# Patient Record
Sex: Female | Born: 1945 | Race: Black or African American | Hispanic: No | Marital: Single | Smoking: Never smoker
Health system: Southern US, Community
[De-identification: ages and names within clinical notes are randomized; demographics above are authoritative.]

## PROBLEM LIST (undated history)

## (undated) DIAGNOSIS — M1990 Unspecified osteoarthritis, unspecified site: Secondary | ICD-10-CM

## (undated) DIAGNOSIS — M545 Low back pain, unspecified: Secondary | ICD-10-CM

## (undated) DIAGNOSIS — K219 Gastro-esophageal reflux disease without esophagitis: Secondary | ICD-10-CM

## (undated) DIAGNOSIS — E039 Hypothyroidism, unspecified: Secondary | ICD-10-CM

## (undated) DIAGNOSIS — F32A Depression, unspecified: Secondary | ICD-10-CM

## (undated) DIAGNOSIS — Z9289 Personal history of other medical treatment: Secondary | ICD-10-CM

## (undated) DIAGNOSIS — G8929 Other chronic pain: Secondary | ICD-10-CM

## (undated) DIAGNOSIS — R0602 Shortness of breath: Secondary | ICD-10-CM

## (undated) DIAGNOSIS — I10 Essential (primary) hypertension: Secondary | ICD-10-CM

## (undated) DIAGNOSIS — F329 Major depressive disorder, single episode, unspecified: Secondary | ICD-10-CM

## (undated) DIAGNOSIS — F419 Anxiety disorder, unspecified: Secondary | ICD-10-CM

## (undated) HISTORY — PX: TOTAL KNEE ARTHROPLASTY: SHX125

## (undated) HISTORY — PX: BACK SURGERY: SHX140

## (undated) HISTORY — PX: EXCISIONAL HEMORRHOIDECTOMY: SHX1541

## (undated) HISTORY — PX: ABDOMINAL HYSTERECTOMY: SHX81

## (undated) HISTORY — PX: CATARACT EXTRACTION W/ INTRAOCULAR LENS  IMPLANT, BILATERAL: SHX1307

## (undated) HISTORY — PX: TUBAL LIGATION: SHX77

## (undated) HISTORY — PX: REDUCTION MAMMAPLASTY: SUR839

## (undated) HISTORY — PX: JOINT REPLACEMENT: SHX530

## (undated) HISTORY — PX: CARPAL TUNNEL RELEASE: SHX101

---

## 1999-06-30 ENCOUNTER — Other Ambulatory Visit: Admission: RE | Admit: 1999-06-30 | Discharge: 1999-06-30 | Payer: Self-pay | Admitting: *Deleted

## 1999-09-04 ENCOUNTER — Encounter: Payer: Self-pay | Admitting: Orthopedic Surgery

## 1999-09-08 ENCOUNTER — Ambulatory Visit (HOSPITAL_COMMUNITY): Admission: RE | Admit: 1999-09-08 | Discharge: 1999-09-08 | Payer: Self-pay | Admitting: Orthopedic Surgery

## 1999-10-05 ENCOUNTER — Encounter: Admission: RE | Admit: 1999-10-05 | Discharge: 1999-10-23 | Payer: Self-pay | Admitting: Orthopedic Surgery

## 2002-03-22 DIAGNOSIS — Z9289 Personal history of other medical treatment: Secondary | ICD-10-CM

## 2002-03-22 HISTORY — DX: Personal history of other medical treatment: Z92.89

## 2002-03-22 HISTORY — PX: POSTERIOR LUMBAR FUSION: SHX6036

## 2004-12-30 ENCOUNTER — Inpatient Hospital Stay (HOSPITAL_COMMUNITY): Admission: RE | Admit: 2004-12-30 | Discharge: 2005-01-04 | Payer: Self-pay | Admitting: Orthopedic Surgery

## 2004-12-30 ENCOUNTER — Ambulatory Visit: Payer: Self-pay | Admitting: Physical Medicine & Rehabilitation

## 2005-01-04 ENCOUNTER — Inpatient Hospital Stay
Admission: RE | Admit: 2005-01-04 | Discharge: 2005-01-08 | Payer: Self-pay | Admitting: Physical Medicine & Rehabilitation

## 2009-06-30 ENCOUNTER — Encounter: Admission: RE | Admit: 2009-06-30 | Discharge: 2009-06-30 | Payer: Self-pay | Admitting: Family Medicine

## 2010-08-07 NOTE — Op Note (Signed)
[place]  Patient:    [name], [name]                      MRN: [id number] Proc. Date: 09/08/99 Adm. Date:  [id number] Attending:  Wende Mott                           Operative Report  PREOPERATIVE DIAGNOSIS:  Internal derangement of right knee.  POSTOPERATIVE DIAGNOSIS: 1. 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 2. Chondral defect of mediofemoral condyle. 3. Loose bodies. 4. Medial meniscal tear. 5. Partial anterior cruciate ligament tear. 6. Chondral defect of the patella inferior pole.  OPERATION: 1. Arthroscopic complete synovectomy. 2. Chondroplasty medial femoral condyle condyle and inferior pole of the    patella. 3. Removal of loose bodies. 4. Medial meniscectomy.  SURGEON:  Kennieth Rad, M.D.  ANESTHESIA:  General  DESCRIPTION OF PROCEDURE:  The patient was taken to the operating room after having been given adequate prior medication.  The patient was given general anesthesia and was intubated.  The right knee was prepped with DuraPrep and draped in a sterile manner.  A tourniquet was used for hemostasis.  A 1/2 inch puncture wound was made along the anteromedial and lateral joint line.  Inflow was through the medial suprapatellar pouch area.  Inspection of the joint revealed chondral defects following the mediofemoral condyle.  Chronic tear of the medial meniscus, chronic ACL tear with remnant stump, fibrotic thickened plica with synovitis medial and lateral compartments as well as suprapatellar pouch area.  There was a chondromalacic defect involving the inferior pole of the patella.  The lateral femoral condyle was well preserved.  With the synovial shaver, complete synovectomy was done at both medial and lateral compartments as well as suprapatellar pouch area.  With basket forceps, a complete meniscectomy of medial meniscus was done from the posterior all the way to the  anterior aspect.  Chondral defect was found in the mediofemoral condyle as well as the anterior pole of the patella and was shaved with meniscus shaver and then smoothed with the arthroscopic rasp. Copious and abundant irrigation was done.  The remnant stump of the ACL was completely resected along with the thickened fibrotic plica.  Further copious and abundant irrigation was done.  Wound closure was then done with 3-0 nylon, and 10 cc of 0.5% Marcaine was injected into the joint.   Compressive dressing was applied, knee immobilizer applied.  The patient tolerated the procedure quite well and went to the recovery room in stable and satisfactory condition.  The patient is being discharged home, nonweightbearing with the use of crutches on the right side, Percocet 1 p.o. p.r.n. for pain, and to continue her previous medications at home.  The patient is to return to our office in one week.  Continue ice packs and elevation at home.  The patient is being discharged in stable and satisfactory condition. DD:  09/08/99 TD:  09/08/99 Job: [id number]

## 2010-08-07 NOTE — H&P (Signed)
[place]. [facility]  Patient:    [name], [name]                      MRN: [id number] Adm. Date:  [id number] Attending:  Wende Mott                         History and Physical  CHIEF COMPLAINT:  Painful swollen right knee.  HISTORY OF PRESENT ILLNESS:  This is a 65 year old female whom I have been following in the office for recurrent pain, swelling, and genu varum of the right knee.  The patient has been treated with anti-inflammatories and exercise with progressive worsening over the past several weeks.  The patient had been using warm compresses to the knee as well as running shoes.  PAST MEDICAL HISTORY:  Bilateral breast biopsy in 1998, tonsillectomy and adenoidectomy, carpal tunnel release bilaterally, cyst removed from breast x 2, arthroscopy of both knees.  High blood pressure.  MEDICATIONS: Premarin, Limbiax, Synthroid, Ultram, Celebrex, Capozide.  ALLERGIES:  None known.  HABITS:  None.  REVIEW OF SYSTEMS:  Basically that of History of Present Illness. No chronic respiratory, no urinary or bowel symptoms.  PHYSICAL EXAMINATION:  VITAL SIGNS: Temperature 98.2, pulse 78, respirations 16, blood pressure 120/70, height 5 feet 4 inches, weight 200 pounds.  HEENT:  Normocephalic.  Eyes: Conjunctivae and sclerae clear.  NECK: Supple.  CHEST:  Clear.  CARDIAC:  S1, S2 regular.  EXTREMITIES:  Right knee genu varum with the lack of full extension by 12 degrees, flexion 100 degrees, tender.  Crepitus with McMurrays test over the medial compartment, anterior, and anterolateral compartments.  Tenderness to palpation, +2 effusion.  Quadriceps muscle tone fair.  IMPRESSION:  Internal derangement of the right knee. DD:  09/08/99 TD:  09/08/99 Job: [id number]

## 2010-08-07 NOTE — H&P (Signed)
[name], BERENGUER NO.:  [id number]   MEDICAL RECORD NO.:  [id number]            PATIENT TYPE:   LOCATION:                                 FACILITY:   PHYSICIAN:  Dyke Brackett, M.D.    DATE OF BIRTH:  06/15/1945   DATE OF ADMISSION:  12/30/2004  DATE OF DISCHARGE:                                HISTORY & PHYSICAL   CHIEF COMPLAINT:  End-stage osteoarthritis, left knee.   HISTORY OF PRESENT ILLNESS:  The patient is a pleasant 65 year old African-  American female with bilateral leg pain, currently left greater than right.  The patient has history of bilateral knee scopes.  The patient denies any  injury to the left knee.  Left knee pain increasingly worse over the past 6  to 7 months.  The patient describes her left knee pain as an intermittent  pain, worse with ambulation.  The pain is sharp in nature.  She does note  some radiation of the left knee pain down the left leg.  Left knee positive  for giving way and catching.  She does have waking pain.  The pain is better  with rest, and Celebrex helps with the inflammation of the left knee.   ALLERGIES:  No known drug allergies.   MEDICATIONS:  1.  Tramadol 50 mg 1 to 2 tablets daily p.r.n. pain.  2.  Benicar stopped 1 month ago.  3.  Toprol 50 mg 1/2 tablet q.a.m. and 1/2 tablet q.p.m.  4.  Celebrex 200 mg 1 tablet daily.  5.  Synthroid 0.075 mg 1 tablet daily.  6.  Librax 5/2.5 mg 1 p.o. q.a.m.  7.  Hydrochlorothiazide 25 mg 1 p.o. daily.  8.  Prevacid 30 mg SolTabs p.r.n.   PAST MEDICAL HISTORY:  1.  Essential hypertension.  2.  Dyslipidemia.  3.  Morbid obesity.  4.  Hypothyroidism.  5.  GERD.  6.  Borderline diabetes.   PAST SURGICAL HISTORY:  1.  Partial hysterectomy in 1976.  2.  Knee arthroscopy in 1992.  3.  Knee arthroscopy in 1998.  4.  Breast reduction in 1998.  5.  Back and hip surgery in 2004.  6.  Carpal tunnel surgery, both hands, 1986 and 1988.  7.  Questionable lipoma removal  underneath right arm in 1963.   The patient denies any complications with the above procedures.  She did  have 2 units of blood after back surgery in 2004.   SOCIAL HISTORY:  The patient denies any tobacco or alcohol use.  She is  divorced.  She has 4 adult children.  She lives in a [place] home with  four steps to the usual entrance.  She is currently disabled.  Primary care  physician is Dr. Maris Berger, [facility], [place].  Phone number is  [phone].   FAMILY HISTORY:  The patient's mother deceased at age 74 due to MVA.  She  did have a history of anemia.  Father deceased age 82, unsure of cause of  death.  She has one living brother who is age 65 and has  heart disease.   REVIEW OF SYSTEMS:  The patient denies any recent cold, coughing, or flu-  like symptoms.  She denies any chest pain, shortness of breath, PND,  orthopnea.  She does note occasional reflux.  She wears dentures on the top  and glasses at all times.  She has hearing difficulty in the right ear.  The  patient does note increased dry mouth over the past couple of weeks.  She is  drinking more liquids.  She relates this to being on hydrochlorothiazide.  She also notes increase in nocturia, going 4 to 5 times a night, especially  over the past 6 months.  Otherwise, her Review of Systems is negative or  noncontributory.   The patient does not have a living will.  She does have a power of attorney,  Haynes Kerns, who is her daughter.   PHYSICAL EXAMINATION:  GENERAL:  Well-developed, well-nourished, overweight  female who walks with the use of a cane.  She does have an antalgic gait and  a limp most notably on the left.  The patient has bilateral valgus  deformities.  The patient's mood and affect are appropriate.  She talks  easily with examiner.  VITAL SIGNS:  The patient's present weight is 208 pounds.  Temperature 97.5  degrees F, pulse 64, respiratory rate 16, blood pressure 132/76.  CARDIAC:   Regular rate and rhythm.  No murmurs, rubs, or gallops noted.  RESPIRATORY: Lungs are clear to auscultation bilaterally.  No wheezing,  rhonchi, or rales noted.  ABDOMEN:  Obese, nontender, positive bowel sounds throughout.  HEENT:  Head is normocephalic and atraumatic without frontal or maxillary  sinus tenderness to palpation.  PERRLA.  EOMs intact.  Conjunctivae pink.  Sclerae anicteric.  No visible external ear deformities noted.  TMs pearly  gray bilaterally.  Nose and nasal septum midline.  Nasal mucosa pink and  moist without polyps.  Buccal mucosa pink and moist.  Pharynx without  erythema or exudate.  The patient has dentures on top, noted not to have any  dentures on the bottom.  Tongue and uvula midline.  NECK:  The patient has full range of motion of the cervical spine without  pain.  Carotids are 2+ without bruits.  No lymphadenopathy.  No tenderness  with palpation over the cervical spine.  Trachea is midline.  BACK: The patient has no tenderness with palpation over the thoracic spine.  She has mild to moderate pain with palpation in the mid lumbar region.  BREASTS/GENITALIA/RECTAL: Exams all deferred at this time.  NEUROLOGIC: The patient is alert and oriented x3.  Cranial nerves II-XII  grossly intact.  Lower extremity strength testing reveals 5/5 strength  throughout, slightly weaker in the left compared to the right, but again  overall 5/5.  Deep tendon reflexes: Knees are 1+ bilaterally, and ankle  jerks are 1+ bilaterally.  MUSCULOSKELETAL:  Upper extremities are equal and symmetric in size and  shape.  The patient has full range of motion of the upper extremities  throughout.  Radial pulses are 2+ bilaterally.  LOWER EXTREMITIES:  In the lower extremities, the patient has full range of  motion of both hips without pain.  Left knee is 0 to 105 degrees flexion. No tenderness to palpation along the joint line.  No effusion and no edema  noted.  She does have  approximately 5 degree varus deformity of the left  knee.  Anterior drawer is negative.  Valgus varus stressing is negative.  Right  knee is 0 to 90 degrees flexion.  She has tenderness to palpation  along the medial joint line.  No effusion, no edema noted.  She has 5  degrees of varus deformity.  Valgus varus stressing is negative.  Anterior  drawer is negative.  The lower extremities are not edematous bilaterally.  Dorsal pedal pulses are 2+ bilaterally.  Otherwise, she has good sensation  to light touch throughout the toes bilaterally.   X-rays bilateral knees shows end-stage osteoarthritis of both knees.   IMPRESSION:  1.  End-stage osteoarthritis bilateral knee, currently left more symptomatic      than right.  2.  Essential hypertension.  3.  Dyslipidemia.  4.  Morbid obesity.  5.  Hypothyroidism.  6.  Gastroesophageal reflux disease.  7.  Questionable diabetes mellitus (borderline on low-modified-carbohydrate      diet).   PLAN:  The patient is to be admitted to [facility] on December 30, 2004, to undergo a left total knee arthroplasty by Dr. Madelon Lips.  The patient  is to undergo all preoperative labs and testing prior to the surgery.  The  patient did receive medical clearance from her primary care physician and  also from her cardiologist, Terald Sleeper, M.D.  The patient did undergo a  stress test which did not reveal any evidence of ischemia.      Richardean Canal, Arnetha Courser, M.D.  Electronically Signed    GC/MEDQ  D:  12/23/2004  T:  12/23/2004  Job:  [id number]   cc:   Maris Berger, M.D.  [place], [place]

## 2010-08-07 NOTE — Discharge Summary (Signed)
[name], [name]               ACCOUNT NO.:  [id number]   MEDICAL RECORD NO.:  [id number]          PATIENT TYPE:  ORB   LOCATION:  4527                         FACILITY:  [facility]   PHYSICIAN:  [name], M.D.DATE OF BIRTH:  [birth year]   DATE OF ADMISSION:  01/04/2005  DATE OF DISCHARGE:  01/07/2005                                 DISCHARGE SUMMARY   DISCHARGE DIAGNOSES:  1.  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  2.  Pain management.  3.  Subcutaneous Lovenox for deep venous thrombosis prophylaxis.  4.  Postoperative anemia.  5.  Hypertension.  6.  Hypokalemia resolved.  7.  Hypothyroidism.  8.  Gastroesophageal reflux disease.   HISTORY OF PRESENT ILLNESS:  This is a 65 year old female admitted December 30, 2004 with end-stage changes of the left knee and no relief with  conservative care.  Underwent a left total knee arthroplasty December 30, 2004 per Dr. Madelon [name].  Placed on subcutaneous Lovenox for deep venous  thrombosis prophylaxis.  PCA morphine discontinued January 02, 2005.  Advised weight-bearing as tolerated left lower extremity.  Postoperative  anemia 9.9, transfused one unit packed red blood cells January 01, 2005 with  followup hemoglobin 10.4.  Hypokalemia 3.1 with supplement added, the  patient was admitted to subacute care services.   PAST MEDICAL HISTORY:  See discharge diagnoses.  No alcohol or tobacco.   ALLERGIES:  NONE.   SOCIAL HISTORY:  Lives alone on disability, one level home, four steps to  entry, family works day shift.   MEDICATIONS PRIOR TO ADMISSION:  1.  Tramadol daily.  2.  Benicar 40-25 daily.  3.  Lopressor 25 mg twice daily.  4.  Celebrex 200 mg daily.  5.  Synthroid 0.75 mg daily.  6.  Librax 5-2.5 daily.  7.  Hydrochlorothiazide 25 mg daily.  8.  Prevacid 30 mg daily.   HOSPITAL COURSE:  The patient with progressive gains while in rehab services  with therapies  initiated daily, the following issues are followed during the  patient's rehab course.  Pertaining to [name]'s left total knee  arthroplasty, surgical site healing nicely, no signs of infection, weight-  bearing as tolerated, ambulating extended household distances with a walker.  Pain management ongoing with the use of oxycodone, Ultram, and Robaxin with  good results.  She was on subcutaneous Lovenox for deep venous thrombosis  prophylaxis, her calves remain cool without any swelling, erythema and  nontender.  Postoperative anemia stable on iron supplement with latest  hemoglobin of 10.  Blood pressures controlled with hydrochlorothiazide and  Lopressor.  During her subacute course with hypokalemia at 3.1, supplement  added, latest potassium of 3.7.  She remained on her hormone supplement for  hypothyroidism.  Prevacid for gastroesophageal reflux disease.   Overall for her functional status she was ambulating household distances  with a walker, needing minimal assist for lower body dressing, she was  independent upper body dressing, home health therapies would be arranged.   Latest labs showed a sodium 139, potassium  3.7, BUN 10, creatinine 0.8,  hemoglobin 10, hematocrit 29.5.   DISCHARGE MEDICATIONS AT TIME OF DICTATION:  1.  Celebrex 200 mg daily.  2.  Trinsicon one capsule twice daily.  3.  Prevacid 30 mg daily.  4.  Hydrochlorothiazide 25 mg daily.  5.  Librax 1 capsule daily.  6.  Synthroid 75 mcg daily.  7.  Lopressor 25 mg twice daily.  8.  Potassium chloride 20 mEq daily.  9.  Robaxin 500 mg every six hours as needed spasms.  10. Ultram 50 mg every six hours as needed pain.   She would followup with Dr. [facility]elon [name] as advised.  Medical  management with her primary care [name] Dr. Sofie [name] of [facility].  Home  health therapies had been arranged.      [name], P.A.      [name], M.D.  Electronically Signed    DA/MEDQ  D:   01/07/2005  T:  01/07/2005  Job:  [id number]   cc:   [name], M.D.  Fax: [phone]

## 2010-08-07 NOTE — Op Note (Signed)
[name], [name]               ACCOUNT NO.:  [id number]   MEDICAL RECORD NO.:  [id number]          PATIENT TYPE:  INP   LOCATION:  5030                         FACILITY:  [facility]   PHYSICIAN:  Dyke Brackett, M.D.    DATE OF BIRTH:  1945-09-30   DATE OF PROCEDURE:  12/30/2004  DATE OF DISCHARGE:                                 OPERATIVE REPORT   PREOPERATIVE DIAGNOSIS:  Osteoarthritis, left knee.   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   OPERATION:  1.  Left total knee replacement (cemented LCS standard femur, standard      patella, 2.5 mm tibia with 15 mm bearing).  2.  Repair infrapatellar tendon.   SURGEON:  Dyke Brackett, M.D.   ASSISTANT:  Richardean Canal, P.A.-C.   TOURNIQUET TIME:  1 hour 7 minutes.   DESCRIPTION OF PROCEDURE:  Sterile prep and drape, exsanguination of the  leg, inflation of the tourniquet to 350.  A straight skin incision, medial  parapatellar approach to the knee was made.  The tibial tubercle showed  significant calcification, possible old Osgood-Schlatter, despite careful  protection, part of the patellar tendon was not intact and was partially  avulsed with repair with Mitek anchors at the conclusion of the case with a  good, stable repair.  It is not a complete tendon issue but was repaired as  part of the procedure.  After the completion of the procedure, the tibial  alignment guide was used cutting about 2-3 mm below the most diseased medial  compartment correcting the varus deformity followed by the anterior  posterior femoral cut, 4 degree valgus cut, balance in the flexion extension  gap at about 15 mm, ACL, PCL were released, small remnants of the menisci  were removed.  Chamfer cuts were placed on the femoral side followed by the  peg holes for the prosthesis, the patella was cut leaving about 13-14 mm of  native patella.  Prior to this, the tibia was cut with  a keel hole for the  prosthesis with a size 2.5 tibia.  Trial reduction was carried out with full  extension, absolutely no tendency to bury and spin out, good stability, and  equal balance in the ligaments.  Anterior and posterior drawer was  negligible.  The trial components were removed, final components were  inserted with the cement in the doughy state.  Antibiotic impregnated cement  was used.  At the conclusion of the case, the cement was allowed to harden,  excess cement was removed, the tourniquet was released, small bleeders were  coagulated.  A Hemovac drain was placed exiting superolaterally and a  Hemovac placed in the lateral gutter.  The patellar tendon was repaired with  Mitek anchors as mentioned.  The capsule was meticulously repaired with #1  Ethibond, subcutaneous tissues with 2-0 Vicryl, and the skin with a stapling  device.  The patient had had a preoperative femoral nerve block.      Dyke Brackett, M.D.  Electronically Signed     WDC/MEDQ  D:  12/30/2004  T:  12/30/2004  Job:  [id number]

## 2010-08-07 NOTE — Discharge Summary (Signed)
[name], [name]               ACCOUNT NO.:  [id number]   MEDICAL RECORD NO.:  [id number]          PATIENT TYPE:  INP   LOCATION:  5030                         FACILITY:  [facility]   PHYSICIAN:  Dyke Brackett, M.D.    DATE OF BIRTH:  1945/09/22   DATE OF ADMISSION:  12/30/2004  DATE OF DISCHARGE:  01/04/2005                                 DISCHARGE SUMMARY   ADMISSION DIAGNOSES:  1.  End-stage osteoarthritis bilateral knees currently left knee worsened      than the right.  2.  Essential hypertension.  3.  Dyslipidemia.  4.  Morbid obesity.  5.  Hypothyroidism.  6.  Gastroesophageal reflux disease.  7.  Questionable diabetes mellitus, borderline on low modified carbohydrate      diet.   DISCHARGE DIAGNOSES:  1.  Status post left total knee arthroplasty.  2.  Acute blood loss anemia secondary to surgery requiring blood      transfusion.  3.  Hypokalemia, treated.  4.  Essential hypertension.  5.  Dyslipidemia.  6.  Morbid obesity.  7.  Hypothyroidism.  8.  Gastroesophageal reflux disease.  9.  Questionable diabetes on modified carbohydrate diet.   HISTORY OF PRESENT ILLNESS:  Mrs. Cozzens is a pleasant 65 year old African  American female with bilateral leg pain, currently left knee being greater  than right.  Patient has history of bilateral knee arthroscopies.  Patient  denies any injury to left knee.  Left knee pain increasingly worse over the  past six to seven months.  Pain is described as intermittent pain worse with  ambulation that is sharp in nature.  Patient does note some radiation of the  left knee pain down the left leg.  Mechanical symptoms positive for giving  way and catching.  Positive waking pain.   ALLERGIES:  NO KNOWN DRUG ALLERGIES.   MEDICATIONS:  1.  Tramadol 50 mg one to two tablets daily p.r.n. pain.  2.  Benicar stopped one month ago.  3.  Toprol 50 mg half tablet q.a.m. and half tablet q.p.m.  4.  Celebrex 200 mg one tablet daily.  5.   Synthroid 0.075 mg daily.  6.  Librax 5/2.5 mg one p.o. q.a.m.  7.  Hydrochlorothiazide 25 mg one p.o. daily.  8.  Prevacid 30 mg Solu-Tab p.r.n.   PROCEDURE:  Patient was taken to the operating room on December 30, 2004, by  Dr. Frederico Hamman, assisted by Richardean Canal, P.A.  Patient was placed  under general anesthesia and received a supplemental femoral nerve block.  Patient tolerated the procedure well and returned to recovery in good stable  condition.  The following components were used for left knee replacement.  A  standard left femoral component, a size 2.5 tibial tray, a metal back  patella standard and the 15 mm poly insert.   CONSULTATIONS:  The following consults were obtained:  1.  Case management.  2.  PT.  3.  OT.  4.  Rehab.   HOSPITAL COURSE:  Postoperative day #1, patient afebrile, vital signs  stable. H&H is 10.1 and 29.4.  Patient  asymptomatic.  Patient was  hypokalemic, 2.8, potassium was replaced.   Next, postoperative #2, patient feeling dizzy and weak, no chest pain, no  shortness of breath, or nausea or vomiting.  Patient's H&H is 9.9 and 28.5.  Heart rate was 103.  Pressure was 154/88.  Patient was transfused with 1  unit packed red blood cells.  Potassium remained low at 2.9 and was  continued to be replaced.  Rehab consult was obtained due to the fact  patient lives alone.   On postoperative day #3, patient with T-max of 101.3, otherwise vital signs  stable.  H&H was 10.4 and 30.5. Patient was asymptomatic in regard to the  anemia.  Potassium was at 3.1.   On postoperative day #4, patient afebrile, vital signs stable.  Patient  without complaints.  Patient was evaluated for rehab bed and once bed became  available that day, she was transferred to rehab in good stable condition.   LABORATORY DATA:  Routine labs on admission, CBC, all values within normal  limits.  Coags on admission, all values within normal limits.  Routine  chemistries on  admission, glucose was slightly elevated at 104, otherwise  all values within normal limits.  Hepatic enzymes on admission, all values  within normal limits.  Urinalysis was negative.  Urine culture was negative  growth.   Two views left knee done December 30, 2004, showed total knee replacement in  satisfactory position.  Soft tissue drain and staples are in place.   MEDICATIONS ON THE FLOOR:  1.  Reglan 10 mg IV q.6h. p.r.n.  2.  Zofran 1 mg IV q.6h. p.r.n.  3.  Benadryl 25 mg IV q.6h. p.r.n.  4.  Colace 100 mg p.o. b.i.d.  5.  Senokot one tablet p.o. p.r.n.  6.  Enema of choice p.r. p.r.n.  7.  Lovenox 30 mg subcu q.12h. total of 14 days, first dose was December 31, 2004, at 7:31 a.m.  8.  Tylenol 325 to 650 mg one p.o. q.4h. p.r.n.  9.  Robaxin 500 mg p.o. q.6h. p.r.n.  10. Restoril 15 mg p.o. nightly p.r.n.  11. Reglan 10 mg p.o. q.8h. p.r.n.  12. Hydrochlorothiazide 25 mg p.o. daily.  13. Protonix 40 mg p.o. daily.  14. Librax one capsule daily.  15. Levothyroxine 75 mg p.o. daily.  16. Ultram 50 to 100 mg p.o. daily.  17. Lopressor 25 mg p.o. b.i.d.  18. Potassium chloride 40 mg p.o. daily.  19. Celebrex 200 mg p.o. daily.   DISCHARGE INSTRUCTIONS:  1.  Medications:  Four medications are to be adjusted by rehab physician.  2.  Diet:  Medication modified carb diet.  3.  Weightbearing:  Patient is weightbearing as tolerated with walker on      left leg.  4.  Wound care:  Wound is to be kept clean, dressing changes daily.  Dr.      Madelon Lips is to be called if patient has temperature greater than 101.5,      any foul smelling drainage or pain not adequately controlled.  Next,      staples will need to be removed 14 days postoperatively.   SPECIAL INSTRUCTIONS:  CPM 0 to 90 degrees six to eight hours a day.  Increase 10 degrees daily.  Patient needs knee immobilizer on when walking  only.  FOLLOW UP:  Patient needs follow-up with Dr. Madelon Lips in the office on   postoperative day #14. Patient is to call the  office at [phone] for an  appointment.   CONDITION ON DISCHARGE:  Patient was discharged postoperative day #4 to  rehab in good stable condition.      Richardean Canal, Arnetha Courser, M.D.  Electronically Signed    GC/MEDQ  D:  02/19/2005  T:  02/19/2005  Job:  [id number]

## 2012-05-02 ENCOUNTER — Other Ambulatory Visit: Payer: Self-pay | Admitting: Internal Medicine

## 2012-05-02 DIAGNOSIS — R51 Headache: Secondary | ICD-10-CM

## 2012-05-09 ENCOUNTER — Ambulatory Visit
Admission: RE | Admit: 2012-05-09 | Discharge: 2012-05-09 | Disposition: A | Discharge status: Home / Self Care | Payer: Medicare Other | Source: Ambulatory Visit | Attending: Internal Medicine | Admitting: Internal Medicine

## 2012-05-09 DIAGNOSIS — R51 Headache: Secondary | ICD-10-CM

## 2012-08-30 ENCOUNTER — Other Ambulatory Visit: Payer: Self-pay | Admitting: Orthopedic Surgery

## 2012-08-30 DIAGNOSIS — M5136 Other intervertebral disc degeneration, lumbar region: Secondary | ICD-10-CM

## 2012-09-06 ENCOUNTER — Other Ambulatory Visit: Payer: Medicare Other

## 2012-09-06 ENCOUNTER — Inpatient Hospital Stay
Admission: RE | Admit: 2012-09-06 | Discharge: 2012-09-06 | Disposition: A | Discharge status: Home / Self Care | Payer: Medicare Other | Source: Ambulatory Visit | Attending: Orthopedic Surgery | Admitting: Orthopedic Surgery

## 2012-09-12 ENCOUNTER — Ambulatory Visit
Admission: RE | Admit: 2012-09-12 | Discharge: 2012-09-12 | Disposition: A | Discharge status: Home / Self Care | Payer: Medicare Other | Source: Ambulatory Visit | Attending: Orthopedic Surgery | Admitting: Orthopedic Surgery

## 2012-09-12 VITALS — BP 120/64 | HR 68 | Ht 62.0 in | Wt 227.0 lb

## 2012-09-12 DIAGNOSIS — M5136 Other intervertebral disc degeneration, lumbar region: Secondary | ICD-10-CM

## 2012-09-12 MED ORDER — ONDANSETRON HCL 4 MG/2ML IJ SOLN
4.0000 mg | Freq: Once | INTRAMUSCULAR | Status: AC
  Administered 2012-09-12: 4 mg via INTRAMUSCULAR

## 2012-09-12 MED ORDER — DIAZEPAM 5 MG PO TABS
5.0000 mg | ORAL_TABLET | Freq: Once | ORAL | Status: AC
  Administered 2012-09-12: 5 mg via ORAL

## 2012-09-12 MED ORDER — MEPERIDINE HCL 100 MG/ML IJ SOLN
75.0000 mg | Freq: Once | INTRAMUSCULAR | Status: AC
  Administered 2012-09-12: 75 mg via INTRAMUSCULAR

## 2012-09-12 MED ORDER — IOHEXOL 180 MG/ML  SOLN
15.0000 mL | Freq: Once | INTRAMUSCULAR | Status: AC | PRN
  Administered 2012-09-12: 15 mL via INTRATHECAL

## 2012-09-12 NOTE — Progress Notes (Signed)
Pt states she has been off tramadol since Saturday.  Explained discharge instructions to pt.JKL RN

## 2013-09-11 ENCOUNTER — Other Ambulatory Visit: Payer: Self-pay | Admitting: Orthopedic Surgery

## 2013-09-11 DIAGNOSIS — M5136 Other intervertebral disc degeneration, lumbar region: Secondary | ICD-10-CM

## 2013-10-05 ENCOUNTER — Ambulatory Visit
Admission: RE | Admit: 2013-10-05 | Discharge: 2013-10-05 | Disposition: A | Discharge status: Home / Self Care | Payer: Medicare HMO | Source: Ambulatory Visit | Attending: Orthopedic Surgery | Admitting: Orthopedic Surgery

## 2013-10-05 VITALS — BP 139/68 | HR 71

## 2013-10-05 DIAGNOSIS — M5136 Other intervertebral disc degeneration, lumbar region: Secondary | ICD-10-CM

## 2013-10-05 MED ORDER — MORPHINE SULFATE 4 MG/ML IJ SOLN
6.0000 mg | Freq: Once | INTRAMUSCULAR | Status: AC
  Administered 2013-10-05: 6 mg via INTRAMUSCULAR

## 2013-10-05 MED ORDER — ONDANSETRON HCL 4 MG/2ML IJ SOLN
4.0000 mg | Freq: Once | INTRAMUSCULAR | Status: AC
  Administered 2013-10-05: 4 mg via INTRAMUSCULAR

## 2013-10-05 MED ORDER — DIAZEPAM 5 MG PO TABS
5.0000 mg | ORAL_TABLET | Freq: Once | ORAL | Status: AC
  Administered 2013-10-05: 5 mg via ORAL

## 2013-10-05 MED ORDER — IOHEXOL 180 MG/ML  SOLN
15.0000 mL | Freq: Once | INTRAMUSCULAR | Status: AC | PRN
  Administered 2013-10-05: 15 mL via INTRA_ARTICULAR

## 2013-10-05 NOTE — Progress Notes (Signed)
Patient states she has been off Tramadol for the past two days.  jkl 

## 2013-10-05 NOTE — Discharge Instructions (Signed)
Myelogram Discharge Instructions  1. Go home and rest quietly for the next 24 hours.  It is important to lie flat for the next 24 hours.  Get up only to go to the restroom.  You may lie in the bed or on a couch on your back, your stomach, your left side or your right side.  You may have one pillow under your head.  You may have pillows between your knees while you are on your side or under your knees while you are on your back.  2. DO NOT drive today.  Recline the seat as far back as it will go, while still wearing your seat belt, on the way home.  3. You may get up to go to the bathroom as needed.  You may sit up for 10 minutes to eat.  You may resume your normal diet and medications unless otherwise indicated.  Drink plenty of extra fluids today and tomorrow.  4. The incidence of a spinal headache with nausea and/or vomiting is about 5% (one in 20 patients).  If you develop a headache, lie flat and drink plenty of fluids until the headache goes away.  Caffeinated beverages may be helpful.  If you develop severe nausea and vomiting or a headache that does not go away with flat bed rest, call [phone].  5. You may resume normal activities after your 24 hours of bed rest is over; however, do not exert yourself strongly or do any heavy lifting tomorrow.  6. Call your physician for a follow-up appointment.   You may resume Tramadol on Saturday, October 06, 2013 after 9:30a.m.

## 2013-11-19 NOTE — H&P (Signed)
[name] is an 68 y.o. female.   History of Present Illness  The patient is a 68 year old female who comes in today for a preoperative history and physical. The patient is scheduled for a XLIF L2-4 W/Removal of hardware and revision to be performed by Dr. Debria Garret D. Shon Baton, MD at [facility] on 09.09.2015 . Symptoms unchanged from previous visit.   Additional reasons for visit:  Follow-up back is described as the following: The patient is being followed for their central back pain. Symptoms reported today include: pain (lower lumbar ), pain at night, weakness (left lower ext. at times ) and numbness (at times about the left lower ext. to the foot level ), while the patient does not report symptoms of: urinary incontinence. The patient states that they are doing poorly. Current treatment includes: activity modification and pain medications. The following medication has been used for pain control: Ultram. The patient reports their current pain level to be 10 / 10 (can increase at times). The patient presents today following CT scan (10/05/13), myelogram (with CT performed at GI ) and physical therapy (patient stopped PT and joined the [facility] and did aquatic therapy ). The patient indicates that they have questions or concerns today regarding pain and their progress at this point. Note for "Follow-up back": Had injection with Dr. Ethelene Hal in November of last year, which helped with her symptoms until around march of this year.  Allergies  No Known Drug Allergies06/05/2012  Family History  Osteoarthritis mother, grandmother mothers side and grandmother fathers side First Degree Relatives reported Cancer First Degree Relatives. child Hypertension brother and child brother Heart Disease brother  Social History Alcohol use never consumed alcohol Children 3 5 or more Drug/Alcohol Rehab (Currently) no Current work status disabled Tobacco use Never smoker. never smoker Pain Contract  no Exercise Exercises weekly; does other Exercises rarely Drug/Alcohol Rehab (Previously) no Illicit drug use no Number of flights of stairs before winded 2-3 less than 1 Marital status divorced Never smoker Living situation live alone Tobacco / smoke exposure no  Medication History  CeleBREX (  Capsule, Oral) Active. Benicar HCT (40-25MG  Tablet, Oral) Active. Omeprazole (  Capsule DR, Oral) Active. ALPRAZolam ER (0.5MG  Tablet ER 24HR, Oral) Active. Synthroid ( Tablet, Oral) Active. Magnesium Oxide (  Capsule, Oral) Active.  Vitals  11/19/2013 10:15 AM Weight: 231 lb Height: 62in Body Surface Area: 2.14 m Body Mass Index: 42.25 kg/m Temp.: 97.80F  Pulse: 96 (Regular)  BP: 136/82 (Sitting, Left Arm, Standard)  Review of Systems  Constitutional: Negative.   HENT: Negative.   Eyes: Negative.   Respiratory: Negative.   Cardiovascular: Negative.   Gastrointestinal: Negative.   Genitourinary: Negative.   Musculoskeletal: Negative.   Skin: Negative.   Neurological: Negative.   Psychiatric/Behavioral: Negative.     There were no vitals taken for this visit. Physical Exam  Constitutional: She is oriented to person, place, and time. She appears well-developed.  HENT:  Head: Normocephalic and atraumatic.  Eyes: EOM are normal. Pupils are equal, round, and reactive to light.  Neck: Normal range of motion.  Cardiovascular: Normal rate and regular rhythm.   Respiratory: Effort normal and breath sounds normal.  GI: Soft. Bowel sounds are normal.  Musculoskeletal: She exhibits tenderness.  Neurological: She is alert and oriented to person, place, and time.  Skin: Skin is warm and dry.  Psychiatric: She has a normal mood and affect.     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  Will proceed with surgery as scheduled.  Procedure along with possible risks and complications discussed  All questions answered.    Essam Lowdermilk M 11/19/2013, 1:40  PM

## 2013-11-20 ENCOUNTER — Encounter (HOSPITAL_COMMUNITY): Payer: Self-pay | Admitting: Pharmacy Technician

## 2013-11-22 ENCOUNTER — Encounter (HOSPITAL_COMMUNITY)
Admission: RE | Admit: 2013-11-22 | Discharge: 2013-11-22 | Disposition: A | Discharge status: Home / Self Care | Payer: Medicare HMO | Source: Ambulatory Visit | Attending: Surgery | Admitting: Surgery

## 2013-11-22 ENCOUNTER — Encounter (HOSPITAL_COMMUNITY): Payer: Self-pay

## 2013-11-22 ENCOUNTER — Encounter (HOSPITAL_COMMUNITY)
Admission: RE | Admit: 2013-11-22 | Discharge: 2013-11-22 | Disposition: A | Discharge status: Home / Self Care | Payer: Medicare HMO | Source: Ambulatory Visit | Attending: Orthopedic Surgery | Admitting: Orthopedic Surgery

## 2013-11-22 DIAGNOSIS — M48061 Spinal stenosis, lumbar region without neurogenic claudication: Secondary | ICD-10-CM | POA: Insufficient documentation

## 2013-11-22 DIAGNOSIS — Z01818 Encounter for other preprocedural examination: Secondary | ICD-10-CM | POA: Insufficient documentation

## 2013-11-22 DIAGNOSIS — Z981 Arthrodesis status: Secondary | ICD-10-CM | POA: Diagnosis present

## 2013-11-22 HISTORY — DX: Depression, unspecified: F32.A

## 2013-11-22 HISTORY — DX: Gastro-esophageal reflux disease without esophagitis: K21.9

## 2013-11-22 HISTORY — DX: Shortness of breath: R06.02

## 2013-11-22 HISTORY — DX: Essential (primary) hypertension: I10

## 2013-11-22 HISTORY — DX: Hypothyroidism, unspecified: E03.9

## 2013-11-22 HISTORY — DX: Major depressive disorder, single episode, unspecified: F32.9

## 2013-11-22 HISTORY — DX: Anxiety disorder, unspecified: F41.9

## 2013-11-22 HISTORY — DX: Unspecified osteoarthritis, unspecified site: M19.90

## 2013-11-22 LAB — COMPREHENSIVE METABOLIC PANEL
ALT: 72 U/L — ABNORMAL HIGH (ref 0–35)
AST: 75 U/L — AB (ref 0–37)
Albumin: 3.9 g/dL (ref 3.5–5.2)
Alkaline Phosphatase: 101 U/L (ref 39–117)
Anion gap: 14 (ref 5–15)
BUN: 15 mg/dL (ref 6–23)
CO2: 27 meq/L (ref 19–32)
CREATININE: 0.83 mg/dL (ref 0.50–1.10)
Calcium: 9.7 mg/dL (ref 8.4–10.5)
Chloride: 100 mEq/L (ref 96–112)
GFR calc Af Amer: 81 mL/min — ABNORMAL LOW (ref >90)
GFR, EST NON AFRICAN AMERICAN: 70 mL/min — AB (ref >90)
Glucose, Bld: 112 mg/dL — ABNORMAL HIGH (ref 70–99)
Potassium: 4.3 mEq/L (ref 3.7–5.3)
Sodium: 141 mEq/L (ref 137–147)
TOTAL PROTEIN: 7.6 g/dL (ref 6.0–8.3)
Total Bilirubin: 0.6 mg/dL (ref 0.3–1.2)

## 2013-11-22 LAB — PROTIME-INR
INR: 1.04 (ref 0.00–1.49)
PROTHROMBIN TIME: 13.6 s (ref 11.6–15.2)

## 2013-11-22 LAB — SURGICAL PCR SCREEN
MRSA, PCR: NEGATIVE
Staphylococcus aureus: NEGATIVE

## 2013-11-22 LAB — URINALYSIS, ROUTINE W REFLEX MICROSCOPIC
BILIRUBIN URINE: NEGATIVE
Glucose, UA: NEGATIVE mg/dL
Hgb urine dipstick: NEGATIVE
Ketones, ur: NEGATIVE mg/dL
NITRITE: NEGATIVE
Protein, ur: NEGATIVE mg/dL
Specific Gravity, Urine: 1.008 (ref 1.005–1.030)
UROBILINOGEN UA: 0.2 mg/dL (ref 0.0–1.0)
pH: 6.5 (ref 5.0–8.0)

## 2013-11-22 LAB — CBC
HCT: 37.6 % (ref 36.0–46.0)
Hemoglobin: 12.7 g/dL (ref 12.0–15.0)
MCH: 29.6 pg (ref 26.0–34.0)
MCHC: 33.8 g/dL (ref 30.0–36.0)
MCV: 87.6 fL (ref 78.0–100.0)
PLATELETS: 241 10*3/uL (ref 150–400)
RBC: 4.29 MIL/uL (ref 3.87–5.11)
RDW: 12.9 % (ref 11.5–15.5)
WBC: 7.1 10*3/uL (ref 4.0–10.5)

## 2013-11-22 LAB — ABO/RH: ABO/RH(D): B POS

## 2013-11-22 LAB — URINE MICROSCOPIC-ADD ON

## 2013-11-22 LAB — APTT: aPTT: 34 seconds (ref 24–37)

## 2013-11-22 NOTE — Pre-Procedure Instructions (Signed)
[name]  11/22/2013   Your procedure is scheduled on:  11/28/13  Report to [facility] Admitting at 630 AM.  Call this number if you have problems the morning of surgery: [phone]   Remember:   Do not eat food or drink liquids after midnight.   Take these medicines the morning of surgery with A SIP OF WATER: xanax,synthroid,prilosec   Do not wear jewelry, make-up or nail polish.  Do not wear lotions, powders, or perfumes. You may wear deodorant.  Do not shave 48 hours prior to surgery. Men may shave face and neck.  Do not bring valuables to the hospital.  [facility] is not responsible                  for any belongings or valuables.               Contacts, dentures or bridgework may not be worn into surgery.  Leave suitcase in the car. After surgery it may be brought to your room.  For patients admitted to the hospital, discharge time is determined by your                treatment team.               Patients discharged the day of surgery will not be allowed to drive  home.  Name and phone number of your driver:  Special Instructions: Shower using CHG 2 nights before surgery and the night before surgery.  If you shower the day of surgery use CHG.  Use special wash - you have one bottle of CHG for all showers.  You should use approximately 1/3 of the bottle for each shower.   Please read over the following fact sheets that you were given: Pain Booklet, Coughing and Deep Breathing, Blood Transfusion Information, MRSA Information and Surgical Site Infection Prevention

## 2013-11-22 NOTE — Progress Notes (Signed)
Ekg,stess test req from dr Sudie Bailey in [place].   #[id number]

## 2013-11-23 LAB — TYPE AND SCREEN
ABO/RH(D): B POS
Antibody Screen: NEGATIVE

## 2013-11-23 NOTE — Progress Notes (Addendum)
Anesthesia Chart Review:  Pt is 68 year old female scheduled for L2-4 interbody fusion, lumbar 2-5 posterior fusion and hardware removal 11/28/13 with Dr. Shon Baton.  PMH: HTN, SOB, GERD, anxiety, depression, hypothyroid  Medications include: olmesartan/hctz, xanax, levothyroxine, prilsec, celebrex  EKG 11/05/13 from PCP office: sinus rhythm, RSR (nondiagnostic), negative T wave (anterior ischemia). None available for comparison.  EKG reviewed by PCP Dr. Sudie Bailey in [place] who cleared pt for surgery.   Preoperative labs reviewed. AST/ALT 75/72  Chest x-ray reviewed.  Lexiscan stress test 05/15/12: -no evidence ischemia -normal EF -no significant symptoms, EKG changes or arrhythmias occurred.   Have requested office visit from surgery clearance visit.   Rica Mast, FNP-BC [facility]/Anesthesiology Phone: [phone] 11/23/2013 3:22 PM  Addendum:  No EKG at [facility]. I called her PCP office for records again today--received after 3 PM.  EKGs on 01/25/12 and 04/26/12 also showed anterior T wave abnormality, consider ischemia.  As above, she had a non-ischemic stress test 05/15/12.  Her 11/05/13 EKG shows more prominent T wave abnormality in V5-6 but otherwise overall stable.  This EKG was already reviewed by her PCP who ultimately cleared patient for surgery.  If no acute changes then I would anticipate that she could proceed as planned.  Anesthesiologist Dr. Noreene Larsson reviewed EKGs and agreed to this plan.  Velna Ochs [facility]/Anesthesiology Phone [phone] 11/27/2013 4:50 PM  No EKG at [facility].

## 2013-11-23 NOTE — Progress Notes (Signed)
PCR result was not resulted this am, called lab and spoke with Cala Bradford in [employer]. She states they never received the specimen. Will get it the day of surgery.

## 2013-11-27 MED ORDER — DEXAMETHASONE SODIUM PHOSPHATE 4 MG/ML IJ SOLN
4.0000 mg | INTRAMUSCULAR | Status: AC
  Administered 2013-11-28: 4 mg via INTRAVENOUS
  Filled 2013-11-27: qty 1

## 2013-11-27 MED ORDER — ACETAMINOPHEN 10 MG/ML IV SOLN
1000.0000 mg | Freq: Four times a day (QID) | INTRAVENOUS | Status: DC
  Filled 2013-11-27: qty 100

## 2013-11-27 MED ORDER — CEFAZOLIN SODIUM-DEXTROSE 2-3 GM-% IV SOLR
2.0000 g | INTRAVENOUS | Status: AC
  Administered 2013-11-28 (×2): 2 g via INTRAVENOUS
  Filled 2013-11-27: qty 50

## 2013-11-28 ENCOUNTER — Encounter (HOSPITAL_COMMUNITY)
Admission: RE | Disposition: A | Discharge status: Skilled Nursing Facility | Payer: Self-pay | Source: Ambulatory Visit | Attending: Orthopedic Surgery

## 2013-11-28 ENCOUNTER — Encounter (HOSPITAL_COMMUNITY): Payer: Self-pay | Admitting: Certified Registered Nurse Anesthetist

## 2013-11-28 ENCOUNTER — Encounter (HOSPITAL_COMMUNITY): Payer: Medicare HMO | Admitting: Emergency Medicine

## 2013-11-28 ENCOUNTER — Inpatient Hospital Stay (HOSPITAL_COMMUNITY): Payer: Medicare HMO

## 2013-11-28 ENCOUNTER — Inpatient Hospital Stay (HOSPITAL_COMMUNITY): Payer: Medicare HMO | Admitting: Certified Registered Nurse Anesthetist

## 2013-11-28 ENCOUNTER — Inpatient Hospital Stay (HOSPITAL_COMMUNITY)
Admission: RE | Admit: 2013-11-28 | Discharge: 2013-11-30 | DRG: 454 | Disposition: A | Discharge status: Skilled Nursing Facility | Payer: Medicare HMO | Source: Ambulatory Visit | Attending: Orthopedic Surgery | Admitting: Orthopedic Surgery

## 2013-11-28 DIAGNOSIS — I10 Essential (primary) hypertension: Secondary | ICD-10-CM | POA: Diagnosis present

## 2013-11-28 DIAGNOSIS — I509 Heart failure, unspecified: Secondary | ICD-10-CM | POA: Diagnosis present

## 2013-11-28 DIAGNOSIS — Z6841 Body Mass Index (BMI) 40.0 and over, adult: Secondary | ICD-10-CM | POA: Diagnosis not present

## 2013-11-28 DIAGNOSIS — F3289 Other specified depressive episodes: Secondary | ICD-10-CM | POA: Diagnosis present

## 2013-11-28 DIAGNOSIS — E039 Hypothyroidism, unspecified: Secondary | ICD-10-CM | POA: Diagnosis present

## 2013-11-28 DIAGNOSIS — Z981 Arthrodesis status: Secondary | ICD-10-CM

## 2013-11-28 DIAGNOSIS — Y831 Surgical operation with implant of artificial internal device as the cause of abnormal reaction of the patient, or of later complication, without mention of misadventure at the time of the procedure: Secondary | ICD-10-CM | POA: Diagnosis present

## 2013-11-28 DIAGNOSIS — M51379 Other intervertebral disc degeneration, lumbosacral region without mention of lumbar back pain or lower extremity pain: Principal | ICD-10-CM | POA: Diagnosis present

## 2013-11-28 DIAGNOSIS — T8489XA Other specified complication of internal orthopedic prosthetic devices, implants and grafts, initial encounter: Secondary | ICD-10-CM | POA: Diagnosis present

## 2013-11-28 DIAGNOSIS — M5126 Other intervertebral disc displacement, lumbar region: Secondary | ICD-10-CM | POA: Diagnosis present

## 2013-11-28 DIAGNOSIS — F411 Generalized anxiety disorder: Secondary | ICD-10-CM | POA: Diagnosis present

## 2013-11-28 DIAGNOSIS — F329 Major depressive disorder, single episode, unspecified: Secondary | ICD-10-CM | POA: Diagnosis present

## 2013-11-28 DIAGNOSIS — I252 Old myocardial infarction: Secondary | ICD-10-CM

## 2013-11-28 DIAGNOSIS — M5137 Other intervertebral disc degeneration, lumbosacral region: Principal | ICD-10-CM | POA: Diagnosis present

## 2013-11-28 DIAGNOSIS — M549 Dorsalgia, unspecified: Secondary | ICD-10-CM | POA: Diagnosis present

## 2013-11-28 HISTORY — DX: Low back pain: M54.5

## 2013-11-28 HISTORY — PX: ANTERIOR LAT LUMBAR FUSION: SHX1168

## 2013-11-28 HISTORY — DX: Other chronic pain: G89.29

## 2013-11-28 HISTORY — DX: Personal history of other medical treatment: Z92.89

## 2013-11-28 HISTORY — PX: POSTERIOR LUMBAR FUSION: SHX6036

## 2013-11-28 HISTORY — DX: Low back pain, unspecified: M54.50

## 2013-11-28 LAB — GLUCOSE, CAPILLARY: Glucose-Capillary: 118 mg/dL — ABNORMAL HIGH (ref 70–99)

## 2013-11-28 LAB — POCT I-STAT 4, (NA,K, GLUC, HGB,HCT)
GLUCOSE: 145 mg/dL — AB (ref 70–99)
HCT: 34 % — ABNORMAL LOW (ref 36.0–46.0)
Hemoglobin: 11.6 g/dL — ABNORMAL LOW (ref 12.0–15.0)
POTASSIUM: 3.8 meq/L (ref 3.7–5.3)
Sodium: 138 mEq/L (ref 137–147)

## 2013-11-28 SURGERY — ANTERIOR LATERAL LUMBAR FUSION 2 LEVELS
Anesthesia: General | Site: Back

## 2013-11-28 MED ORDER — DIPHENHYDRAMINE HCL 12.5 MG/5ML PO ELIX: 12.5000 mg | ORAL_SOLUTION | Freq: Four times a day (QID) | ORAL | Status: DC | PRN

## 2013-11-28 MED ORDER — LIDOCAINE HCL (CARDIAC) 20 MG/ML IV SOLN
INTRAVENOUS | Status: AC
  Filled 2013-11-28: qty 10

## 2013-11-28 MED ORDER — ONDANSETRON HCL 4 MG/2ML IJ SOLN
INTRAMUSCULAR | Status: DC | PRN
  Administered 2013-11-28: 4 mg via INTRAVENOUS

## 2013-11-28 MED ORDER — HYDROMORPHONE HCL PF 1 MG/ML IJ SOLN
0.2500 mg | INTRAMUSCULAR | Status: DC | PRN
  Administered 2013-11-28 (×2): 0.5 mg via INTRAVENOUS

## 2013-11-28 MED ORDER — THROMBIN 20000 UNITS EX KIT
PACK | CUTANEOUS | Status: DC | PRN
  Administered 2013-11-28: 20000 [IU] via TOPICAL

## 2013-11-28 MED ORDER — LEVOTHYROXINE SODIUM 75 MCG PO TABS
75.0000 ug | ORAL_TABLET | Freq: Every day | ORAL | Status: DC
  Administered 2013-11-29 – 2013-11-30 (×2): 75 ug via ORAL
  Filled 2013-11-28 (×3): qty 1

## 2013-11-28 MED ORDER — 0.9 % SODIUM CHLORIDE (POUR BTL) OPTIME
TOPICAL | Status: DC | PRN
  Administered 2013-11-28: 1000 mL

## 2013-11-28 MED ORDER — ONDANSETRON HCL 4 MG/2ML IJ SOLN
INTRAMUSCULAR | Status: AC
  Filled 2013-11-28: qty 4

## 2013-11-28 MED ORDER — OXYCODONE HCL 5 MG/5ML PO SOLN
5.0000 mg | Freq: Once | ORAL | Status: DC | PRN
Start: 2013-11-28 — End: 2013-11-28

## 2013-11-28 MED ORDER — MENTHOL 3 MG MT LOZG: 1.0000 | LOZENGE | OROMUCOSAL | Status: DC | PRN

## 2013-11-28 MED ORDER — HYDROCHLOROTHIAZIDE 25 MG PO TABS
25.0000 mg | ORAL_TABLET | Freq: Every day | ORAL | Status: DC
  Administered 2013-11-29 – 2013-11-30 (×2): 25 mg via ORAL
  Filled 2013-11-28 (×2): qty 1

## 2013-11-28 MED ORDER — MAGNESIUM CITRATE PO SOLN: 1.0000 | Freq: Once | ORAL | Status: AC | PRN

## 2013-11-28 MED ORDER — SUFENTANIL CITRATE 50 MCG/ML IV SOLN
INTRAVENOUS | Status: AC
  Filled 2013-11-28: qty 1

## 2013-11-28 MED ORDER — THROMBIN 20000 UNITS EX SOLR
CUTANEOUS | Status: AC
  Filled 2013-11-28: qty 20000

## 2013-11-28 MED ORDER — FENTANYL CITRATE 0.05 MG/ML IJ SOLN
INTRAMUSCULAR | Status: AC
  Filled 2013-11-28: qty 5

## 2013-11-28 MED ORDER — OXYCODONE HCL 5 MG PO TABS
10.0000 mg | ORAL_TABLET | ORAL | Status: DC | PRN
  Administered 2013-11-28 – 2013-11-29 (×2): 10 mg via ORAL
  Administered 2013-11-29 (×2): 5 mg via ORAL
  Administered 2013-11-29: 10 mg via ORAL
  Administered 2013-11-29 – 2013-11-30 (×4): 5 mg via ORAL
  Filled 2013-11-28 (×9): qty 2

## 2013-11-28 MED ORDER — HYDROMORPHONE HCL PF 1 MG/ML IJ SOLN
INTRAMUSCULAR | Status: AC
  Filled 2013-11-28: qty 1

## 2013-11-28 MED ORDER — ACETAMINOPHEN 325 MG PO TABS: 325.0000 mg | ORAL_TABLET | ORAL | Status: DC | PRN

## 2013-11-28 MED ORDER — DEXAMETHASONE 4 MG PO TABS
4.0000 mg | ORAL_TABLET | Freq: Four times a day (QID) | ORAL | Status: DC
  Administered 2013-11-29 – 2013-11-30 (×4): 4 mg via ORAL
  Filled 2013-11-28 (×10): qty 1

## 2013-11-28 MED ORDER — DOCUSATE SODIUM 100 MG PO CAPS
100.0000 mg | ORAL_CAPSULE | Freq: Two times a day (BID) | ORAL | Status: DC
  Administered 2013-11-29 – 2013-11-30 (×3): 100 mg via ORAL
  Filled 2013-11-28 (×5): qty 1

## 2013-11-28 MED ORDER — MIDAZOLAM HCL 5 MG/5ML IJ SOLN
INTRAMUSCULAR | Status: DC | PRN
  Administered 2013-11-28: 2 mg via INTRAVENOUS

## 2013-11-28 MED ORDER — METHOCARBAMOL 500 MG PO TABS
500.0000 mg | ORAL_TABLET | Freq: Four times a day (QID) | ORAL | Status: DC | PRN
  Administered 2013-11-28 – 2013-11-30 (×2): 500 mg via ORAL
  Filled 2013-11-28 (×2): qty 1

## 2013-11-28 MED ORDER — ACETAMINOPHEN 10 MG/ML IV SOLN
INTRAVENOUS | Status: DC | PRN
  Administered 2013-11-28: 1000 mg via INTRAVENOUS

## 2013-11-28 MED ORDER — FENTANYL CITRATE 0.05 MG/ML IJ SOLN
INTRAMUSCULAR | Status: DC | PRN
  Administered 2013-11-28: 50 ug via INTRAVENOUS
  Administered 2013-11-28: 150 ug via INTRAVENOUS
  Administered 2013-11-28: 50 ug via INTRAVENOUS

## 2013-11-28 MED ORDER — SODIUM CHLORIDE 0.9 % IV SOLN: 250.0000 mL | INTRAVENOUS | Status: DC

## 2013-11-28 MED ORDER — MIDAZOLAM HCL 2 MG/2ML IJ SOLN
INTRAMUSCULAR | Status: AC
  Filled 2013-11-28: qty 2

## 2013-11-28 MED ORDER — PROPOFOL 10 MG/ML IV BOLUS
INTRAVENOUS | Status: DC | PRN
  Administered 2013-11-28 (×2): 20 mg via INTRAVENOUS
  Administered 2013-11-28: 140 mg via INTRAVENOUS

## 2013-11-28 MED ORDER — SUCCINYLCHOLINE CHLORIDE 20 MG/ML IJ SOLN
INTRAMUSCULAR | Status: DC | PRN
  Administered 2013-11-28: 80 mg via INTRAVENOUS

## 2013-11-28 MED ORDER — DEXAMETHASONE SODIUM PHOSPHATE 4 MG/ML IJ SOLN
4.0000 mg | Freq: Four times a day (QID) | INTRAMUSCULAR | Status: DC
  Administered 2013-11-29 (×2): 4 mg via INTRAVENOUS
  Filled 2013-11-28 (×10): qty 1

## 2013-11-28 MED ORDER — HEMOSTATIC AGENTS (NO CHARGE) OPTIME
TOPICAL | Status: DC | PRN
  Administered 2013-11-28: 1 via TOPICAL

## 2013-11-28 MED ORDER — PHENYLEPHRINE HCL 10 MG/ML IJ SOLN
INTRAMUSCULAR | Status: DC | PRN
  Administered 2013-11-28: 80 ug via INTRAVENOUS
  Administered 2013-11-28: 40 ug via INTRAVENOUS
  Administered 2013-11-28 (×2): 80 ug via INTRAVENOUS
  Administered 2013-11-28: 40 ug via INTRAVENOUS

## 2013-11-28 MED ORDER — SUFENTANIL CITRATE 50 MCG/ML IV SOLN
50.0000 ug | INTRAVENOUS | Status: DC | PRN
  Administered 2013-11-28: .2 ug/kg/h via INTRAVENOUS

## 2013-11-28 MED ORDER — LACTATED RINGERS IV SOLN
INTRAVENOUS | Status: DC
  Administered 2013-11-29: 04:00:00 via INTRAVENOUS

## 2013-11-28 MED ORDER — SODIUM CHLORIDE 0.9 % IJ SOLN: 9.0000 mL | INTRAMUSCULAR | Status: DC | PRN

## 2013-11-28 MED ORDER — NALOXONE HCL 0.4 MG/ML IJ SOLN: 0.4000 mg | INTRAMUSCULAR | Status: DC | PRN

## 2013-11-28 MED ORDER — GLYCOPYRROLATE 0.2 MG/ML IJ SOLN
INTRAMUSCULAR | Status: DC | PRN
  Administered 2013-11-28: 0.4 mg via INTRAVENOUS

## 2013-11-28 MED ORDER — LIDOCAINE HCL (CARDIAC) 20 MG/ML IV SOLN
INTRAVENOUS | Status: DC | PRN
  Administered 2013-11-28: 50 mg via INTRAVENOUS
  Administered 2013-11-28: 80 mg via INTRAVENOUS

## 2013-11-28 MED ORDER — BUPIVACAINE-EPINEPHRINE 0.25% -1:200000 IJ SOLN
INTRAMUSCULAR | Status: DC | PRN
  Administered 2013-11-28: 9 mL

## 2013-11-28 MED ORDER — KETAMINE HCL 100 MG/ML IJ SOLN
INTRAMUSCULAR | Status: AC
  Filled 2013-11-28: qty 1

## 2013-11-28 MED ORDER — DIPHENHYDRAMINE HCL 50 MG/ML IJ SOLN: 12.5000 mg | Freq: Four times a day (QID) | INTRAMUSCULAR | Status: DC | PRN

## 2013-11-28 MED ORDER — PROPOFOL 10 MG/ML IV BOLUS
INTRAVENOUS | Status: AC
  Filled 2013-11-28: qty 20

## 2013-11-28 MED ORDER — IRBESARTAN 300 MG PO TABS
300.0000 mg | ORAL_TABLET | Freq: Every day | ORAL | Status: DC
  Administered 2013-11-29 – 2013-11-30 (×2): 300 mg via ORAL
  Filled 2013-11-28 (×2): qty 1

## 2013-11-28 MED ORDER — METHOCARBAMOL 1000 MG/10ML IJ SOLN
500.0000 mg | Freq: Four times a day (QID) | INTRAVENOUS | Status: DC | PRN
  Filled 2013-11-28: qty 5

## 2013-11-28 MED ORDER — ACETAMINOPHEN 10 MG/ML IV SOLN
1000.0000 mg | Freq: Once | INTRAVENOUS | Status: AC
  Administered 2013-11-28: 1000 mg via INTRAVENOUS
  Filled 2013-11-28: qty 100

## 2013-11-28 MED ORDER — ONDANSETRON HCL 4 MG/2ML IJ SOLN
4.0000 mg | INTRAMUSCULAR | Status: DC | PRN
Start: 2013-11-28 — End: 2013-11-29

## 2013-11-28 MED ORDER — OXYCODONE HCL 5 MG PO TABS: 5.0000 mg | ORAL_TABLET | Freq: Once | ORAL | Status: DC | PRN

## 2013-11-28 MED ORDER — BUPIVACAINE-EPINEPHRINE (PF) 0.25% -1:200000 IJ SOLN
INTRAMUSCULAR | Status: AC
  Filled 2013-11-28: qty 30

## 2013-11-28 MED ORDER — LACTATED RINGERS IV SOLN
INTRAVENOUS | Status: DC | PRN
  Administered 2013-11-28 (×2): via INTRAVENOUS

## 2013-11-28 MED ORDER — MORPHINE SULFATE (PF) 1 MG/ML IV SOLN
INTRAVENOUS | Status: DC
  Administered 2013-11-28: 20:00:00 via INTRAVENOUS
  Administered 2013-11-29: 2 mg via INTRAVENOUS
  Administered 2013-11-29: 1 mg via INTRAVENOUS

## 2013-11-28 MED ORDER — CEFAZOLIN SODIUM 1-5 GM-% IV SOLN
1.0000 g | Freq: Three times a day (TID) | INTRAVENOUS | Status: AC
  Administered 2013-11-29 (×2): 1 g via INTRAVENOUS
  Filled 2013-11-28 (×2): qty 50

## 2013-11-28 MED ORDER — SODIUM CHLORIDE 0.9 % IJ SOLN: 3.0000 mL | INTRAMUSCULAR | Status: DC | PRN

## 2013-11-28 MED ORDER — SODIUM CHLORIDE 0.9 % IV SOLN
500.0000 mg | INTRAVENOUS | Status: DC | PRN
  Administered 2013-11-28: 10 ug/kg/min via INTRAVENOUS

## 2013-11-28 MED ORDER — SODIUM CHLORIDE 0.9 % IJ SOLN: 3.0000 mL | Freq: Two times a day (BID) | INTRAMUSCULAR | Status: DC

## 2013-11-28 MED ORDER — ACETAMINOPHEN 160 MG/5ML PO SOLN
325.0000 mg | ORAL | Status: DC | PRN
Start: 2013-11-28 — End: 2013-11-28

## 2013-11-28 MED ORDER — LACTATED RINGERS IV SOLN
INTRAVENOUS | Status: DC | PRN
  Administered 2013-11-28 (×3): via INTRAVENOUS

## 2013-11-28 MED ORDER — PHENOL 1.4 % MT LIQD: 1.0000 | OROMUCOSAL | Status: DC | PRN

## 2013-11-28 MED ORDER — MUPIROCIN 2 % EX OINT: 1.0000 "application " | TOPICAL_OINTMENT | Freq: Once | CUTANEOUS | Status: DC

## 2013-11-28 MED ORDER — MORPHINE SULFATE (PF) 1 MG/ML IV SOLN
INTRAVENOUS | Status: AC
  Filled 2013-11-28: qty 25

## 2013-11-28 MED ORDER — ONDANSETRON HCL 4 MG/2ML IJ SOLN
4.0000 mg | Freq: Four times a day (QID) | INTRAMUSCULAR | Status: DC | PRN
Start: 2013-11-28 — End: 2013-11-29
  Administered 2013-11-29: 4 mg via INTRAVENOUS
  Filled 2013-11-28: qty 2

## 2013-11-28 MED ORDER — OLMESARTAN MEDOXOMIL-HCTZ 40-25 MG PO TABS: 1.0000 | ORAL_TABLET | Freq: Every day | ORAL | Status: DC

## 2013-11-28 SURGICAL SUPPLY — 118 items
ADH SKN CLS APL DERMABOND .7 (GAUZE/BANDAGES/DRESSINGS) ×1
APPLIER CLIP 11 MED OPEN (CLIP)
APR CLP MED 11 20 MLT OPN (CLIP)
ATTRACTOMAT 16X20 MAGNETIC DRP (DRAPES) ×1 IMPLANT
BLADE SURG 10 STRL SS (BLADE) ×3 IMPLANT
BLADE SURG ROTATE 9660 (MISCELLANEOUS) IMPLANT
BONE MATRIX OSTEOCEL PRO MED (Bone Implant) ×6 IMPLANT
BUR EGG ELITE 4.0 (BURR) ×2 IMPLANT
BUR EGG ELITE 4.0MM (BURR) ×1
CLIP APPLIE 11 MED OPEN (CLIP) IMPLANT
CLOSURE STERI-STRIP 1/2X4 (GAUZE/BANDAGES/DRESSINGS) ×3
CLOSURE WOUND 1/2 X4 (GAUZE/BANDAGES/DRESSINGS)
CLSR STERI-STRIP ANTIMIC 1/2X4 (GAUZE/BANDAGES/DRESSINGS) ×4 IMPLANT
CONT SPECI 4OZ STER CLIK (MISCELLANEOUS) ×2 IMPLANT
CORDS BIPOLAR (ELECTRODE) ×5 IMPLANT
COVER MAYO STAND STRL (DRAPES) IMPLANT
COVER SURGICAL LIGHT HANDLE (MISCELLANEOUS) ×3 IMPLANT
DERMABOND ADVANCED (GAUZE/BANDAGES/DRESSINGS) ×2
DERMABOND ADVANCED .7 DNX12 (GAUZE/BANDAGES/DRESSINGS) ×1 IMPLANT
DIGITIZER BENDINI (MISCELLANEOUS) ×2 IMPLANT
DRAPE C-ARM 42X72 X-RAY (DRAPES) ×10 IMPLANT
DRAPE C-ARMOR (DRAPES) ×7 IMPLANT
DRAPE ORTHO SPLIT 77X108 STRL (DRAPES) ×3
DRAPE POUCH INSTRU U-SHP 10X18 (DRAPES) ×3 IMPLANT
DRAPE STERI IOBAN 125X83 (DRAPES) ×2 IMPLANT
DRAPE SURG 17X23 STRL (DRAPES) ×5 IMPLANT
DRAPE SURG ORHT 6 SPLT 77X108 (DRAPES) ×1 IMPLANT
DRAPE U-SHAPE 47X51 STRL (DRAPES) ×5 IMPLANT
DRSG ADAPTIC 3X8 NADH LF (GAUZE/BANDAGES/DRESSINGS) ×1 IMPLANT
DRSG MEPILEX BORDER 4X12 (GAUZE/BANDAGES/DRESSINGS) IMPLANT
DRSG MEPILEX BORDER 4X4 (GAUZE/BANDAGES/DRESSINGS) IMPLANT
DRSG MEPILEX BORDER 4X8 (GAUZE/BANDAGES/DRESSINGS) ×7 IMPLANT
DURAPREP 26ML APPLICATOR (WOUND CARE) ×3 IMPLANT
ELECT BLADE 4.0 EZ CLEAN MEGAD (MISCELLANEOUS) ×9
ELECT BLADE 6.5 EXT (BLADE) ×1 IMPLANT
ELECT CAUTERY BLADE 6.4 (BLADE) ×5 IMPLANT
ELECT PENCIL ROCKER SW 15FT (MISCELLANEOUS) ×7 IMPLANT
ELECT REM PT RETURN 9FT ADLT (ELECTROSURGICAL) ×3
ELECTRODE BLDE 4.0 EZ CLN MEGD (MISCELLANEOUS) ×1 IMPLANT
ELECTRODE REM PT RTRN 9FT ADLT (ELECTROSURGICAL) ×1 IMPLANT
GAUZE SPONGE 4X4 16PLY XRAY LF (GAUZE/BANDAGES/DRESSINGS) ×3 IMPLANT
GLOVE BIOGEL PI IND STRL 8 (GLOVE) ×1 IMPLANT
GLOVE BIOGEL PI IND STRL 8.5 (GLOVE) ×1 IMPLANT
GLOVE BIOGEL PI INDICATOR 8 (GLOVE) ×2
GLOVE BIOGEL PI INDICATOR 8.5 (GLOVE) ×2
GLOVE ECLIPSE 8.5 STRL (GLOVE) ×9 IMPLANT
GLOVE ORTHO TXT STRL SZ7.5 (GLOVE) ×3 IMPLANT
GOWN STRL REUS W/ TWL LRG LVL3 (GOWN DISPOSABLE) ×1 IMPLANT
GOWN STRL REUS W/TWL 2XL LVL3 (GOWN DISPOSABLE) ×6 IMPLANT
GOWN STRL REUS W/TWL LRG LVL3 (GOWN DISPOSABLE) ×3
GUIDEWIRE NITINOL BEVEL TIP (WIRE) ×16 IMPLANT
IMPL COROENT LDTXL 10X18X55 (Intraocular Lens) IMPLANT
IMPLANT COROENT LDTXL 10X18X50 (Intraocular Lens) ×2 IMPLANT
IMPLANT COROENT LDTXL 10X18X55 (Intraocular Lens) ×6 IMPLANT
KIT BASIN OR (CUSTOM PROCEDURE TRAY) ×3 IMPLANT
KIT DILATOR XLIF 5 (KITS) IMPLANT
KIT MAXCESS (KITS) ×2 IMPLANT
KIT NDL NVM5 EMG ELECT (KITS) IMPLANT
KIT NEEDLE NVM5 EMG ELECT (KITS) ×1 IMPLANT
KIT NEEDLE NVM5 EMG ELECTRODE (KITS) ×2
KIT POSITION SURG JACKSON T1 (MISCELLANEOUS) ×3 IMPLANT
KIT ROOM TURNOVER OR (KITS) ×3 IMPLANT
KIT XLIF (KITS) ×2
MIX DBX 20CC MTF (Putty) ×2 IMPLANT
NDL I-PASS III (NEEDLE) IMPLANT
NDL SPNL 18GX3.5 QUINCKE PK (NEEDLE) ×1 IMPLANT
NEEDLE 22X1 1/2 (OR ONLY) (NEEDLE) ×7 IMPLANT
NEEDLE I-PASS III (NEEDLE) ×3 IMPLANT
NEEDLE SPNL 18GX3.5 QUINCKE PK (NEEDLE) ×3 IMPLANT
NS IRRIG 1000ML POUR BTL (IV SOLUTION) ×5 IMPLANT
PACK LAMINECTOMY ORTHO (CUSTOM PROCEDURE TRAY) ×3 IMPLANT
PACK UNIVERSAL I (CUSTOM PROCEDURE TRAY) ×7 IMPLANT
PAD ARMBOARD 7.5X6 YLW CONV (MISCELLANEOUS) ×6 IMPLANT
PATTIES SURGICAL .5 X.5 (GAUZE/BANDAGES/DRESSINGS) IMPLANT
PATTIES SURGICAL .5 X1 (DISPOSABLE) ×1 IMPLANT
ROD RELINE MAS ST 5.5X300MM (Rod) ×2 IMPLANT
ROD RELINE STR 5.5X160 (Rod) ×2 IMPLANT
SCREW LOCK RELINE 5.5 TULIP (Screw) ×16 IMPLANT
SCREW MAS RELINE POLY 6.5X40 (Screw) ×8 IMPLANT
SCREW RELINE MAS POLY 7.5X40MM (Screw) ×8 IMPLANT
SHEET CONFORM 45LX20WX7H (Bone Implant) ×4 IMPLANT
SPONGE INTESTINAL PEANUT (DISPOSABLE) ×6 IMPLANT
SPONGE LAP 18X18 X RAY DECT (DISPOSABLE) ×4 IMPLANT
SPONGE LAP 4X18 X RAY DECT (DISPOSABLE) ×12 IMPLANT
SPONGE SURGIFOAM ABS GEL 100 (HEMOSTASIS) ×3 IMPLANT
STRIP CLOSURE SKIN 1/2X4 (GAUZE/BANDAGES/DRESSINGS) IMPLANT
SURGIFLO TRUKIT (HEMOSTASIS) ×2 IMPLANT
SUT BONE WAX W31G (SUTURE) ×3 IMPLANT
SUT MNCRL AB 3-0 PS2 18 (SUTURE) ×2 IMPLANT
SUT MON AB 3-0 SH 27 (SUTURE) ×9
SUT MON AB 3-0 SH27 (SUTURE) ×2 IMPLANT
SUT PROLENE 5 0 C 1 24 (SUTURE) IMPLANT
SUT SILK 2 0 TIES 10X30 (SUTURE) ×1 IMPLANT
SUT SILK 3 0 TIES 10X30 (SUTURE) ×1 IMPLANT
SUT VIC AB 0 CT1 27 (SUTURE) ×6
SUT VIC AB 0 CT1 27XBRD ANBCTR (SUTURE) IMPLANT
SUT VIC AB 1 CT1 27 (SUTURE) ×6
SUT VIC AB 1 CT1 27XBRD ANBCTR (SUTURE) ×2 IMPLANT
SUT VIC AB 1 CTX 18 (SUTURE) ×5 IMPLANT
SUT VIC AB 1 CTX 36 (SUTURE)
SUT VIC AB 1 CTX36XBRD ANBCTR (SUTURE) ×2 IMPLANT
SUT VIC AB 2-0 CT1 18 (SUTURE) ×3 IMPLANT
SUT VIC AB 2-0 CT1 27 (SUTURE) ×9
SUT VIC AB 2-0 CT1 TAPERPNT 27 (SUTURE) IMPLANT
SYR 30ML LL (SYRINGE) ×2 IMPLANT
SYR BULB IRRIGATION 50ML (SYRINGE) ×3 IMPLANT
SYR CONTROL 10ML LL (SYRINGE) ×7 IMPLANT
SYRINGE 12CC LL (MISCELLANEOUS) ×2 IMPLANT
TOWEL NATURAL 10PK STERILE (DISPOSABLE) IMPLANT
TOWEL OR 17X24 6PK STRL BLUE (TOWEL DISPOSABLE) ×3 IMPLANT
TOWEL OR 17X26 10 PK STRL BLUE (TOWEL DISPOSABLE) ×3 IMPLANT
TRAY FOLEY CATH 14FR (SET/KITS/TRAYS/PACK) ×2 IMPLANT
TRAY FOLEY CATH 16FR SILVER (SET/KITS/TRAYS/PACK) ×1 IMPLANT
TRAY FOLEY CATH 16FRSI W/METER (SET/KITS/TRAYS/PACK) ×1 IMPLANT
TUBE CONNECTING 12'X1/4 (SUCTIONS) ×2
TUBE CONNECTING 12X1/4 (SUCTIONS) ×2 IMPLANT
WATER STERILE IRR 1000ML POUR (IV SOLUTION) ×1 IMPLANT
YANKAUER SUCT BULB TIP NO VENT (SUCTIONS) ×7 IMPLANT

## 2013-11-28 NOTE — H&P (Signed)
The patient returns today for follow up with her myelogram. 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 She has a prominent left cord disc herniation at L2-3 causing lateral recess stenosis. There is also moderate central stenosis at L3-4. There is broad based disc, advanced facet hypertrophy, central stenosis is worse on the right side than the left. She has what looks like to be some loosening around the pedicle screw, but it appears to be a stable fusion at 4-5 and 5-1 without significant stenosis. It also should be noted that the retrolisthesis at L3-4 has progressed.  At this point in time I think the major problem is the L2-3, 3-4 level. To address this surgically would require most likely a two level XLIF procedure at 2-3, 3-4, and then extending her fusion pedicle screw fixation up to L2 posteriorly.In addition a posterior lateral arthrodesis from L2-5 will also address the pseudoarthrosis at L4/5.    Risks of surgery include, but are not limited to: Death, stroke, paralysis, nerve root damage/injury, bleeding, blood clots, loss of bowel/bladder control, sexual dysfunction, retrograde ejaculation, hardware failure, or malposition, spinal fluid leak, adjacent segment disease, non-union, need for further surgery, ongoing or worse pain, injury to bladder, bowel and abdominal contents, infection and recurrent disc herniation

## 2013-11-28 NOTE — Anesthesia Preprocedure Evaluation (Addendum)
Anesthesia Evaluation  Patient identified by MRN, date of birth, ID band Patient awake    Reviewed: Allergy & Precautions, H&P , NPO status , Patient's Chart, lab work & pertinent test results  History of Anesthesia Complications Negative for: history of anesthetic complications  Airway Mallampati: III TM Distance: >3 FB Neck ROM: Full    Dental  (+) Edentulous Upper,    Pulmonary  breath sounds clear to auscultation        Cardiovascular hypertension, Pt. on medications - angina- Past MI and - CHF Rhythm:Regular     Neuro/Psych PSYCHIATRIC DISORDERS Anxiety Depression Low back pain  Neuromuscular disease    GI/Hepatic Neg liver ROS, GERD-  Medicated and Controlled,  Endo/Other  Hypothyroidism Morbid obesity  Renal/GU negative Renal ROS     Musculoskeletal   Abdominal   Peds  Hematology   Anesthesia Other Findings   Reproductive/Obstetrics                          Anesthesia Physical Anesthesia Plan  ASA: II  Anesthesia Plan: General   Post-op Pain Management:    Induction: Intravenous  Airway Management Planned: Oral ETT  Additional Equipment: None  Intra-op Plan:   Post-operative Plan: Extubation in OR  Informed Consent: I have reviewed the patients History and Physical, chart, labs and discussed the procedure including the risks, benefits and alternatives for the proposed anesthesia with the patient or authorized representative who has indicated his/her understanding and acceptance.   Dental advisory given  Plan Discussed with: CRNA and Surgeon  Anesthesia Plan Comments:         Anesthesia Quick Evaluation

## 2013-11-28 NOTE — Op Note (Signed)
NAMEALIKA, EPPES NO.:  [id number]  MEDICAL RECORD NO.:  [id number]  LOCATION:  5N22C                        FACILITY:  [facility]  PHYSICIAN:  Sierra Beal, MD    DATE OF BIRTH:  15-Feb-1946  DATE OF PROCEDURE:  11/28/2013 DATE OF DISCHARGE:                              OPERATIVE REPORT   PREOPERATIVE DIAGNOSES: 1. 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 2. Pseudoarthrosis, L4-5. 3. Previous L3-S1 instrumented fusion.  POSTOPERATIVE DIAGNOSES: 1. 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 2. Pseudoarthrosis, L4-5. 3. Previous L3-S1 instrumented fusion.  OPERATIVE PROCEDURES: 1. Lateral interbody fusion, L3-4, L2-3 with NuVasive PEEK interbody     cage, 10 x 18 with 8-degree lordosis, packed with Osteocel. 2. Removal of posterior pedicle screws, L3, L4, L5 bilaterally. 3. Posterolateral arthrodesis with DBX mix and allograft bone. 4. Segmental pedicle screw fixation, L2 through L5.  COMPLICATIONS:  None.  SURGEON:  Sierra Beal, MD  FIRST ASSISTANT:  Genene Churn. Denton Meek.  HISTORY:  This is a very pleasant 68 year old who has been having severe debilitating back pain for some time now.  Attempts at conservative management have failed to alleviate her symptoms and so she elected to proceed with surgery.  All appropriate risks, benefits, and alternatives of surgery were discussed with the patient and consent was obtained.  OPERATIVE NOTE:  The patient was brought to the operating room, placed supine on the operating table.  After successful induction of general anesthesia and endotracheal intubation, TEDs, SCDs, and a Foley were inserted.  The intraoperative EMG needle monitors were placed to monitor EMGs as well as SSEPs.  The patient was then placed into the lateral decubitus position, left side up.  Axillary roll was positioned.  All bony prominences were well padded.  Using fluoroscopic guidance,  I confirmed satisfactory positioning in a direct lateral both the AP and lateral planes.  I then taped the patient down to the table, so she would not move.  She was now properly positioned for lateral interbody fusion.  The lateral side of the flank was then prepped and draped in a standard fashion.  Time-out was taken, confirming the patient, procedure, and all other pertinent important data.  An incision was made centered over the L3 vertebral body.  This was infiltrated with 0.25% Marcaine.  Incision was made and sharp dissection was carried out down to the deep fascia.  The deep fascia of the external oblique was identified.  I then made a second incision 1 fingerbreadth posterior to this about the size of my finger.  I then bluntly dissected through the adipose tissue down to the posterior retroperitoneal fascia.  I then popped through this and then began bluntly dissecting the adipose tissue in the retroperitoneal space to create a plane.  I could palpate the undersurface of the ribs as well as the surface of the psoas.  I then bluntly dissected through the oblique muscles and then advanced my trocar from the lateral incision down to the lateral aspect of the spine.  I then extended the psoas muscle to confirm I was not coming down on top of the plexus.  I then  advanced it through the psoas muscle just posterior to the midportion, so I about the posterior third of the disk space.  I confirmed position in the AP and lateral planes.  I then secured it with a guide pin and then stimulated in a circumferential fashion to ensure there was no trauma to the plexus.  I then sequentially dilated up again stemming it with each dilator.  I then placed the retractor system in and secured to the disk space with the trocar.  I then advanced it forward, so I had good exposure of the L3-4 disk space.  I confirmed satisfactory position in AP and lateral planes.  Once this was done, an annulotomy  was performed with a 10-blade scalpel.  Then, using a combination of pituitary rongeurs, various curettes, and Kerrison rongeurs, I removed the channel of disk to the contralateral side.  I then released the contralateral annulus with an osteotome with a Cobb elevator.  At this point, I could clearly visualize across the disk space, I rasped the endplates.  I then sequentially trialed and felt that 10 lordotic spacer gave the adequate fixation.  I then obtained the PEEK interbody spacer, packed with Osteocel and malleted it to appropriate position.  I had excellent fixation across the disk space at L3-4, good contact with both endplates. At this point, I then removed the retracting system.  Through the same incision, I used the same technique to identify the L2-3 disk.  Once it was identified, I then sequentially dilated in a similar fashion and placed my retracting system and secured it appropriately.  Using the same technique that I had used at L3-4, I performed a complete diskectomy at L2-3.  I elected to use the same size spacer.  When I was placing this, I noticed that the implant had cracked.  Because of this, I elected to remove it and placed one that was not cracked.  Once I had removed it, I continued to do a little bit more anterior diskectomy, so I had a better positioning of my implant.  I then placed the implant. This was a 50-mm long, 10 x 18, 8-degree lordotic spacer.  This too had excellent fixation.  At this point, with both XLIFS completed, I irrigated the wounds copiously with normal saline and obtained hemostasis using bipolar electrocautery.  I then closed the deep fascia of the oblique with a running interrupted #1 Vicryl sutures, superficial with 2-0 Vicryl sutures, and 3-0 Monocryl for the skin.  The secondary stab incision was closed in a layered fashion as well with #1 Vicryl, 2- 0 Vicryl, and 3-0 Monocryl.  Steri-Strips and a dry dressing were applied.  The  patient was then turned.  The drapes were removed and the patient was placed supine on the table.  I then brought in the Anna spine frame and turned the patient prone onto the spine frame.  All bony prominences were well padded.  The back was prepped and draped in a standard fashion.  I then took an x-ray to confirm that I actually had improved sagittal alignment via the XLIF that I placed and the positioning on the table.  With the improved alignment in the sagittal plane, I elected to place my posterior pedicle screw system and with that pedicle screw system, got another about 6 degrees of lordosis to get a very good sagittal plane restoration.  Once the back was prepped and draped, x-ray was brought into the field sterilely, and I have identified the lateral  border of the L2 pedicle. Small incision was made and I advanced the Jamshidi needle down to the junction of the transverse process and facet complex disk on the lateral side of the pedicle.  I then advanced this using fluoro as well as real time EMG monitoring through the pedicle and into the vertebral body.  I placed a guide pin through this and repeated this on the contralateral side.  Once both L2 pedicle screws were cannulated, I went to L3 and did the same procedure through a separate incision.  With pedicles cannulated, I then went and performed a standard Wiltse approach through a second incision to expose the L4-5, L5-S1 pedicle screw construct.  I gently dissected down to the deep fascia.  I incised this and I bluntly dissected through the paraspinal muscles to expose the pedicle screw rod construct.  I then used the Universal removal system to remove all 3 locking bolts on the 1 side.  The right 5-5 rod was noted to be fractured right within the L5 pedicle.  This actually allowed to remove the rod with greater ease.  Once the rod was removed, I then removed each of the pedicle screws.  The L5-S1 was a solid fusion, so  I elected not to place pedicle screws into the body of S1.  Once all 3 L4, L5, and S1 pedicle screws were out, I then palpated the holes and then tapped with a 6.5-diameter tap and then placed a 40-mm 7.5-diameter pedicle screw which is an increased diameter from the one that I had removed. With the L4 and L5 pedicle screws in position, I then tapped over the guide pin that I had placed at L2 and L3 and placed screws into these levels as well.  Once 1 side was done, I went to the contralateral side through the same Wiltse approach.  I identified and removed the pedicle screw rod construct at L4-5, S1.  New screws were placed at L4 and L5 in the same standard fashion.  Using the NuVasive bending system, I then contoured my rod and placed it into the posterolateral gutter.  Prior to doing this, I did use a high-speed burr to decorticate the transverse processes.  I packed DBX mix along with Conform allograft bone into the posterolateral gutter and then secured my screws into position.  All screws had excellent purchase.  I then placed a rod from head to foot and locked it into place.  All locking caps were torqued down appropriately.  At this point, all wounds were copiously irrigated.  The deep fascia of each wound was closed with interrupted #1 Vicryl sutures, then a layer of 0 Vicryl sutures, then 2-0 Vicryl sutures, and 3-0 Monocryl.  Steri-Strips and a dry dressing were applied.  The patient was ultimately extubated, transferred to the PACU without incident.  At the end of the case, all needle and sponge counts were correct.  There were no adverse intraoperative events other than the fracture of the cage which was rectified.  First assistant was Zonia Kief, [place], who was instrumental in assisting with wound closure, positioning, suction.     Sierra Beal, MD     DDB/MEDQ  D:  11/28/2013  T:  11/28/2013  Job:  [id number]

## 2013-11-28 NOTE — Transfer of Care (Signed)
Immediate Anesthesia Transfer of Care Note  Patient: [name]  Procedure(s) Performed: Procedure(s): XLIF L2-4  (N/A) POSTERIOR FUSION L2-5 WITH REMOVAL OF HARDWARE  (N/A)  Patient Location: PACU  Anesthesia Type:General  Level of Consciousness: awake  Airway & Oxygen Therapy: Patient Spontanous Breathing and Patient connected to nasal cannula oxygen  Post-op Assessment: Report given to PACU RN, Post -op Vital signs reviewed and stable and Patient moving all extremities  Post vital signs: Reviewed and stable  Complications: No apparent anesthesia complications

## 2013-11-28 NOTE — Brief Op Note (Signed)
11/28/2013  5:23 PM  PATIENT:  [name]  68 y.o. 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,DDD WITH STENOSIS  PROCEDURE:  Procedure(s): XLIF L2-4  (N/A) POSTERIOR FUSION L2-5 WITH REMOVAL OF HARDWARE  (N/A)  SURGEON:  Surgeon(s) and Role:    * Venita Lick, MD - Primary  PHYSICIAN ASSISTANT:   ASSISTANTS: Zonia Kief   ANESTHESIA:   general  EBL:  Total I/O In: 3500 [I.V.:3500] Out: 2050 [Urine:1350; Blood:700]  BLOOD ADMINISTERED:none  DRAINS: none   LOCAL MEDICATIONS USED:  MARCAINE     SPECIMEN:  No Specimen  DISPOSITION OF SPECIMEN:  N/A  COUNTS:  YES  TOURNIQUET:  * No tourniquets in log *  DICTATION: .Other Dictation: Dictation Number [id number]  PLAN OF CARE: Admit to inpatient   PATIENT DISPOSITION:  PACU - hemodynamically stable.

## 2013-11-29 ENCOUNTER — Encounter (HOSPITAL_COMMUNITY): Payer: Self-pay | Admitting: General Practice

## 2013-11-29 ENCOUNTER — Inpatient Hospital Stay (HOSPITAL_COMMUNITY): Payer: Medicare HMO

## 2013-11-29 LAB — CBC
HEMATOCRIT: 30.5 % — AB (ref 36.0–46.0)
Hemoglobin: 10.1 g/dL — ABNORMAL LOW (ref 12.0–15.0)
MCH: 29.5 pg (ref 26.0–34.0)
MCHC: 33.1 g/dL (ref 30.0–36.0)
MCV: 89.2 fL (ref 78.0–100.0)
Platelets: 262 10*3/uL (ref 150–400)
RBC: 3.42 MIL/uL — ABNORMAL LOW (ref 3.87–5.11)
RDW: 13 % (ref 11.5–15.5)
WBC: 13.3 10*3/uL — ABNORMAL HIGH (ref 4.0–10.5)

## 2013-11-29 MED ORDER — CLONIDINE HCL 0.1 MG PO TABS
0.1000 mg | ORAL_TABLET | Freq: Two times a day (BID) | ORAL | Status: DC | PRN
  Administered 2013-11-29: 0.1 mg via ORAL
  Filled 2013-11-29: qty 1

## 2013-11-29 MED ORDER — WHITE PETROLATUM GEL
Status: AC
  Administered 2013-11-29: 1
  Filled 2013-11-29: qty 5

## 2013-11-29 NOTE — Anesthesia Postprocedure Evaluation (Signed)
  Anesthesia Post-op Note  Patient: [name]  Procedure(s) Performed: Procedure(s): XLIF L2-4  (N/A) POSTERIOR FUSION L2-5 WITH REMOVAL OF HARDWARE  (N/A)  Patient Location: PACU  Anesthesia Type:General  Level of Consciousness: awake, alert  and oriented  Airway and Oxygen Therapy: Patient Spontanous Breathing  Post-op Pain: moderate  Post-op Assessment: Post-op Vital signs reviewed  Post-op Vital Signs: Reviewed  Last Vitals:  Filed Vitals:   11/29/13 1349  BP: 180/63  Pulse: 91  Temp: 37.4 C  Resp: 18    Complications: No apparent anesthesia complications

## 2013-11-29 NOTE — Clinical Social Work Note (Signed)
Clinical Social Work Department BRIEF PSYCHOSOCIAL ASSESSMENT 11/29/2013  Patient:  [name], [name]     Account Number:  [id number]     Admit date:  11/28/2013  Clinical Social Worker:  Cristy Folks  Date/Time:  11/29/2013 12:54 PM  Referred by:  Physician  Date Referred:  11/28/2013 Referred for  SNF Placement   Other Referral:   n/A   Interview type:  Other - See comment Other interview type:   Family    PSYCHOSOCIAL DATA Living Status:  WITH  ADULT Child Admitted from facility:   Level of care:  N/A Primary support name:  Taiwan Millon [phone] Primary support relationship to patient:  CHILD, ADULT Degree of support available:   Good support    CURRENT CONCERNS  Other Concerns:  N/A  SOCIAL WORK ASSESSMENT / PLAN Pt admitted to hospital for back surgery due to back pain. Pt currently lives with her daughter who is a Engineer, site. Pt reported that she would like to go to a SNF for rehab. Pt has [employer].   Assessment/plan status:  Psychosocial Support/Ongoing Assessment of Needs Other assessment/ plan:   N/A   Information/referral to community resources:   N/A    PATIENT'S/FAMILY'S RESPONSE TO PLAN OF CARE: SW role explained, pt/family voiced understanding. SW will continue to follow.     Derrell Lolling, MSW Clinical Social Worker [phone]

## 2013-11-29 NOTE — Progress Notes (Signed)
Foley dc'd at [phone].  Patient is due to void.  Lower lip swollen due to surgical tubing.  Patient stated this happened with her last surgery.  Lip is not hurting and patient is not very concerned regarding the swelling.  Ice pack is an option if patient changes her mind.

## 2013-11-29 NOTE — Evaluation (Signed)
Occupational Therapy Evaluation Patient Details Name: [name] MRN: [id number] DOB: 10/26/1945 Today's Date: 11/29/2013    History of Present Illness pt presents post L2-4 Fusion with hardware removal.     Clinical Impression   Pt presents to OT with decreased I with ADL activity due to problems below. Pt will benefit from skilled OT to increase I with ADL activity and return to PLOF    Follow Up Recommendations  SNF    Equipment Recommendations  None recommended by OT (family will obtain a toilet aid. Pt has other AE)       Precautions / Restrictions Precautions Precautions: Back Precaution Booklet Issued: Yes (comment) Precaution Comments: pt recalled back precautions from previous surgery.   Required Braces or Orthoses: Spinal Brace Spinal Brace: Lumbar corset;Applied in sitting position Restrictions Weight Bearing Restrictions: No      Mobility Bed Mobility Overal bed mobility: Needs Assistance Bed Mobility: Rolling;Sidelying to Sit;Sit to Supine Rolling: Min assist Sidelying to sit: Min assist   Sit to supine: Mod assist   General bed mobility comments: cues and hand over hand for log roll and with bringing trunk up to sitting.    Transfers Overall transfer level: Needs assistance Equipment used: Rolling walker (2 wheeled) Transfers: Sit to/from Stand Sit to Stand: Min assist         General transfer comment: cues for UE use and safe technique.      Balance Overall balance assessment: Needs assistance         Standing balance support: Single extremity supported Standing balance-Leahy Scale: Poor                              ADL Overall ADL's : Needs assistance/impaired Eating/Feeding: Set up;Sitting   Grooming: Sitting;Set up   Upper Body Bathing: Minimal assitance;Sitting   Lower Body Bathing: Sit to/from stand;Adhering to back precautions;Maximal assistance   Upper Body Dressing : Sitting;Moderate assistance Upper Body  Dressing Details (indicate cue type and reason): brace Lower Body Dressing: Maximal assistance;Sit to/from stand;Cueing for back precautions Lower Body Dressing Details (indicate cue type and reason): will need AE- pt does have Toilet Transfer: Moderate assistance;BSC;RW   Toileting- Clothing Manipulation and Hygiene: Maximal assistance;Sit to/from stand Toileting - Clothing Manipulation Details (indicate cue type and reason): pt will benefit from a toilet aid     Functional mobility during ADLs: Moderate assistance General ADL Comments: verbal cues for hand placement      Vision                            Pertinent Vitals/Pain Pain Assessment: 0-10 Pain Score: 7  Pain Location: back Pain Descriptors / Indicators: Discomfort;Sore Pain Intervention(s): Limited activity within patient's tolerance;Monitored during session;Premedicated before session;Repositioned        Extremity/Trunk Assessment Upper Extremity Assessment Upper Extremity Assessment: Generalized weakness   Lower Extremity Assessment Lower Extremity Assessment: Generalized weakness   Cervical / Trunk Assessment Cervical / Trunk Assessment: Normal   Communication Communication Communication: No difficulties   Cognition Arousal/Alertness: Awake/alert Behavior During Therapy: WFL for tasks assessed/performed Overall Cognitive Status: Within Functional Limits for tasks assessed                     General Comments   pt very very motivated!      Home Living Family/patient expects to be discharged to:: Skilled nursing facility  Prior Functioning/Environment Level of Independence: Independent with assistive device(s) (pt used cane vs RW.  )             OT Diagnosis: Generalized weakness;Acute pain   OT Problem List: Decreased strength;Decreased activity tolerance;Pain;Decreased knowledge of use of DME or AE   OT  Treatment/Interventions: Self-care/ADL training;DME and/or AE instruction;Patient/family education    OT Goals(Current goals can be found in the care plan section) Acute Rehab OT Goals Patient Stated Goal: get better and do what i want to do OT Goal Formulation: With patient Time For Goal Achievement: 12/13/13 Potential to Achieve Goals: Good  OT Frequency: Min 2X/week   Barriers to D/C: Decreased caregiver support             End of Session Equipment Utilized During Treatment: Rolling walker;Back brace Nurse Communication: Mobility status  Activity Tolerance: Patient limited by pain Patient left: in bed;with family/visitor present;with call bell/phone within reach   Time: 1232-1305 OT Time Calculation (min): 33 min Charges:  OT General Charges $OT Visit: 1 Procedure OT Evaluation $Initial OT Evaluation Tier I: 1 Procedure OT Treatments $Self Care/Home Management : 23-37 mins G-Codes:    Einar Crow D Dec 07, 2013, 1:17 PM

## 2013-11-29 NOTE — Progress Notes (Signed)
Pt with elevated BP's throughout day shift: 189/76, 180/63, 180/88. Pt asympotmatic. Arsenio Loader, PA paged. Orders received for clonidine 0.1mg  PO q12hr for systolic >160, diastolic >100. Will administer and continue to monitor.

## 2013-11-29 NOTE — Evaluation (Signed)
Physical Therapy Evaluation Patient Details Name: [name] MRN: [id number] DOB: 03/03/1946 Today's Date: 11/29/2013   History of Present Illness  pt presents post L2-4 Fusion with hardware removal.    Clinical Impression  Pt generally weak and deconditioned.  Family unable to provide adequate A at home and are interested in continued rehab for pt prior to returning to home.  Feel pt would benefit from ST-SNF to maximize independence prior to return home.  Will continue to follow.      Follow Up Recommendations SNF    Equipment Recommendations  None recommended by PT    Recommendations for Other Services       Precautions / Restrictions Precautions Precautions: Back Precaution Booklet Issued: Yes (comment) Precaution Comments: pt recalled back precautions from previous surgery.   Required Braces or Orthoses: Spinal Brace Spinal Brace: Lumbar corset;Applied in sitting position Restrictions Weight Bearing Restrictions: No      Mobility  Bed Mobility Overal bed mobility: Needs Assistance Bed Mobility: Rolling;Sidelying to Sit Rolling: Min assist Sidelying to sit: Min assist       General bed mobility comments: cues and hand over hand for log roll and with bringing trunk up to sitting.    Transfers Overall transfer level: Needs assistance Equipment used: Rolling walker (2 wheeled) Transfers: Sit to/from Stand Sit to Stand: Min assist         General transfer comment: cues for UE use and safe technique.    Ambulation/Gait Ambulation/Gait assistance: Min guard Ambulation Distance (Feet): 60 Feet (and 10) Assistive device: Rolling walker (2 wheeled) Gait Pattern/deviations: Step-through pattern;Decreased stride length     General Gait Details: pt moves slowly and demos good use of RW.  pt indicates weakness in L knee > R knee.  pt fatigues easily.    Stairs            Wheelchair Mobility    Modified Rankin (Stroke Patients Only)       Balance  Overall balance assessment: Needs assistance         Standing balance support: Single extremity supported Standing balance-Leahy Scale: Poor                               Pertinent Vitals/Pain Pain Assessment: 0-10 Pain Score: 4  Pain Location: back Pain Descriptors / Indicators: Aching Pain Intervention(s): Premedicated before session;Repositioned    Home Living Family/patient expects to be discharged to:: Skilled nursing facility                      Prior Function Level of Independence: Independent with assistive device(s) (pt used cane vs RW.  )               Hand Dominance        Extremity/Trunk Assessment   Upper Extremity Assessment: Defer to OT evaluation           Lower Extremity Assessment: Generalized weakness      Cervical / Trunk Assessment: Normal  Communication   Communication: No difficulties  Cognition Arousal/Alertness: Awake/alert Behavior During Therapy: WFL for tasks assessed/performed Overall Cognitive Status: Within Functional Limits for tasks assessed                      General Comments      Exercises        Assessment/Plan    PT Assessment Patient needs continued PT services  PT  Diagnosis Difficulty walking;Generalized weakness   PT Problem List Decreased strength;Decreased activity tolerance;Decreased balance;Decreased mobility;Decreased knowledge of use of DME;Decreased knowledge of precautions;Obesity  PT Treatment Interventions DME instruction;Gait training;Stair training;Functional mobility training;Therapeutic activities;Therapeutic exercise;Balance training;Patient/family education   PT Goals (Current goals can be found in the Care Plan section) Acute Rehab PT Goals Patient Stated Goal: Feel stronger PT Goal Formulation: With patient Time For Goal Achievement: 12/13/13 Potential to Achieve Goals: Good    Frequency Min 5X/week   Barriers to discharge Decreased caregiver  support      Co-evaluation               End of Session Equipment Utilized During Treatment: Gait belt;Back brace Activity Tolerance: Patient limited by fatigue;Patient limited by pain Patient left: in chair;with call bell/phone within reach;with family/visitor present Nurse Communication: Mobility status         Time: 1013-1050 PT Time Calculation (min): 37 min   Charges:   PT Evaluation $Initial PT Evaluation Tier I: 1 Procedure PT Treatments $Gait Training: 8-22 mins $Therapeutic Activity: 8-22 mins   PT G CodesSunny Schlein, [place] [phone] 11/29/2013, 11:11 AM

## 2013-11-29 NOTE — Progress Notes (Signed)
Subjective: Doing well.  Good pain control   Lives alone.  Will need short SNF placement.    Objective: Vital signs in last 24 hours: Temp:  [97.1 F (36.2 C)-99.7 F (37.6 C)] 99.7 F (37.6 C) (09/10 0548) Pulse Rate:  [82-113] 96 (09/10 0548) Resp:  [14-19] 18 (09/10 0800) BP: (134-201)/(73-98) 189/79 mmHg (09/10 0548) SpO2:  [97 %-100 %] 97 % (09/10 0548)  Intake/Output from previous day: 09/09 0701 - 09/10 0700 In: 4000 [I.V.:4000] Out: 2800 [Urine:2100; Blood:700] Intake/Output this shift: Total I/O In: 120 [P.O.:120] Out: -    Recent Labs  11/28/13 1452 11/29/13 0625  HGB 11.6* 10.1*    Recent Labs  11/28/13 1452 11/29/13 0625  WBC  --  13.3*  RBC  --  3.42*  HCT 34.0* 30.5*  PLT  --  262    Recent Labs  11/28/13 1452  NA 138  K 3.8  GLUCOSE 145*   No results found for this basename: LABPT, INR,  in the last 72 hours  Neurovascular intact Dorsiflexion/Plantar flexion intact Compartment soft bilat calves nontender.  Lower lip swollen.   Assessment/Plan: Will need short snf placement.  Transfer when bed available.    OWENS,JAMES M 11/29/2013, 12:06 PM

## 2013-11-29 NOTE — Care Management Note (Signed)
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     Action/Plan:   Patient will need shortterm rehab at [facility], social worker notified. Patient's sisters are assisting with decision making.   Anticipated DC Date:  11/30/2013   Anticipated DC Plan:  SKILLED NURSING FACILITY  In-house referral  Clinical Social Worker      DC Planning Services  CM consult      Choice offered to / List presented to:     DME arranged  NA        HH arranged  NA      Status of service:  Completed, signed off Medicare Important Message given?  NA - LOS <3 / Initial given by admissions (If response is "NO", the following Medicare IM given date fields will be blank) Date Medicare IM given:   Medicare IM given by:   Date Additional Medicare IM given:   Additional Medicare IM given by:    Discharge Disposition:  SKILLED NURSING FACILITY  Per UR Regulation:  Reviewed for med. necessity/level of care/duration of stay

## 2013-11-29 NOTE — Progress Notes (Signed)
Utilization review completed.  

## 2013-11-30 MED ORDER — POLYETHYLENE GLYCOL 3350 17 G PO PACK: 17.0000 g | PACK | Freq: Every day | ORAL | Status: AC

## 2013-11-30 MED ORDER — OXYCODONE-ACETAMINOPHEN 10-325 MG PO TABS: 1.0000 | ORAL_TABLET | Freq: Four times a day (QID) | ORAL | Status: AC | PRN

## 2013-11-30 MED ORDER — METHOCARBAMOL 500 MG PO TABS: 500.0000 mg | ORAL_TABLET | Freq: Four times a day (QID) | ORAL | Status: AC | PRN

## 2013-11-30 MED ORDER — ONDANSETRON 4 MG PO TBDP: 4.0000 mg | ORAL_TABLET | Freq: Three times a day (TID) | ORAL | Status: AC | PRN

## 2013-11-30 MED ORDER — DOCUSATE SODIUM 100 MG PO CAPS: 100.0000 mg | ORAL_CAPSULE | Freq: Two times a day (BID) | ORAL | Status: AC

## 2013-11-30 NOTE — Discharge Summary (Signed)
Patient ID: [name] MRN: [id number] DOB/AGE: [birth year] 68 y.o.  Admit date: 11/28/2013 Discharge date: 11/30/2013  Admission Diagnoses:  Active Problems:   Back pain   Discharge Diagnoses:  Active Problems:   Back pain  status post Procedure(s): XLIF L2-4  POSTERIOR FUSION L2-5 WITH REMOVAL OF HARDWARE   Past Medical History  Diagnosis Date  . Hypertension   . Shortness of breath   . Depression   . Hypothyroidism   . Anxiety   . GERD (gastroesophageal reflux disease)   . History of blood transfusion 2004    "related to back OR"  . Arthritis     "multiple places; hands, feet, etc." (11/29/2013)  . Chronic lower back pain     Surgeries: Procedure(s): XLIF L2-4  POSTERIOR FUSION L2-5 WITH REMOVAL OF HARDWARE  on 11/28/2013   Consultants:    Discharged Condition: Improved  Hospital Course: [name] is an 68 y.o. female who was admitted 11/28/2013 for operative treatment of lumbar stenosis. Patient failed conservative treatments (please see the history and physical for the specifics) and had severe unremitting pain that affects sleep, daily activities and work/hobbies. After pre-op clearance, the patient was taken to the operating room on 11/28/2013 and underwent  Procedure(s): XLIF L2-4  POSTERIOR FUSION L2-5 WITH REMOVAL OF HARDWARE .    Patient was given perioperative antibiotics: Anti-infectives   Start     Dose/Rate Route Frequency Ordered Stop   11/28/13 2200  ceFAZolin (ANCEF) IVPB 1 g/50 mL premix     1 g 100 mL/hr over 30 Minutes Intravenous Every 8 hours 11/28/13 2128 11/29/13 0629   11/27/13 1359  ceFAZolin (ANCEF) IVPB 2 g/50 mL premix     2 g 100 mL/hr over 30 Minutes Intravenous 30 min pre-op 11/27/13 1359 11/28/13 1307       Patient was given sequential compression devices and early ambulation to prevent DVT.   Patient benefited maximally from hospital stay and there were no complications. At the time of discharge, the patient was  urinating/moving their bowels without difficulty, tolerating a regular diet, pain is controlled with oral pain medications and they have been cleared by PT/OT.   Recent vital signs: Patient Vitals for the past 24 hrs:  BP Temp Temp src Pulse Resp SpO2  11/30/13 0506 160/63 mmHg 99.3 F (37.4 C) Oral 99 16 93 %  11/30/13 0305 162/70 mmHg 98.8 F (37.1 C) Oral 94 16 93 %  11/29/13 2200 187/71 mmHg 98.9 F (37.2 C) Oral 97 16 94 %  11/29/13 1900 180/88 mmHg 99.2 F (37.3 C) - - 18 -  11/29/13 1349 180/63 mmHg 99.4 F (37.4 C) Oral 91 18 100 %     Recent laboratory studies:  Recent Labs  11/28/13 1452 11/29/13 0625  WBC  --  13.3*  HGB 11.6* 10.1*  HCT 34.0* 30.5*  PLT  --  262  NA 138  --   K 3.8  --   GLUCOSE 145*  --      Discharge Medications:     Medication List    STOP taking these medications       celecoxib 200 MG capsule  Commonly known as:  CELEBREX      TAKE these medications       ALPRAZolam 0.5 MG tablet  Commonly known as:  XANAX  Take 0.5 mg by mouth 3 (three) times daily as needed for anxiety.     CALCIUM 500 +D 500-400 MG-UNIT Tabs  Generic drug:  Calcium Carb-Cholecalciferol  Take 1 tablet by mouth daily.     docusate sodium 100 MG capsule  Commonly known as:  COLACE  Take 1 capsule (100 mg total) by mouth 2 (two) times daily.     famotidine 20 MG tablet  Commonly known as:  PEPCID  Take 20 mg by mouth daily as needed for heartburn or indigestion.     levothyroxine 75 MCG tablet  Commonly known as:  SYNTHROID, LEVOTHROID  Take 75 mcg by mouth daily before breakfast.     magnesium oxide 400 MG tablet  Commonly known as:  MAG-OX  Take 400 mg by mouth daily.     methocarbamol 500 MG tablet  Commonly known as:  ROBAXIN  Take 1 tablet (500 mg total) by mouth every 6 (six) hours as needed for muscle spasms.     olmesartan-hydrochlorothiazide 40-25 MG per tablet  Commonly known as:  BENICAR HCT  Take 1 tablet by mouth daily.      omeprazole 20 MG capsule  Commonly known as:  PRILOSEC  Take 20 mg by mouth daily.     ondansetron 4 MG disintegrating tablet  Commonly known as:  ZOFRAN ODT  Take 1 tablet (4 mg total) by mouth every 8 (eight) hours as needed.     oxyCODONE-acetaminophen 10-325 MG per tablet  Commonly known as:  PERCOCET  Take 1 tablet by mouth every 6 (six) hours as needed for pain.     polyethylene glycol packet  Commonly known as:  MIRALAX / GLYCOLAX  Take 17 g by mouth daily.        Diagnostic Studies: Dg Chest 2 View  11/22/2013   CLINICAL DATA:  Preoperative exam prior to lumbar surgery  EXAM: CHEST  2 VIEW  COMPARISON:  PA and lateral chest of March 02, 2013  FINDINGS: The lungs are adequately inflated. There is no focal infiltrate. There is stable scarring lateral to the left heart border. The cardiac silhouette is top-normal in size but stable. The pulmonary vascularity is normal. The mediastinum is normal in width. There is no pleural effusion. There is degenerative disc space narrowing at multiple thoracic levels.  IMPRESSION: There is no acute cardiopulmonary abnormality.   Electronically Signed   By: David  [place]   On: 11/22/2013 17:04   Dg Lumbar Spine 2-3 Views  11/29/2013   CLINICAL DATA:  Postop lumbar spine fusion  EXAM: LUMBAR SPINE - 2-3 VIEW  COMPARISON:  C-arm spot films of 11/28/2013  FINDINGS: Using the same number is system as utilized on the CT myelogram of 10/05/2013, posterior fusion has been performed from L2-L5. Interbody fusion plugs are present L2-3 and L3-4. Very slight anterolisthesis of L4 on L5 is stable.  IMPRESSION: Postop posterior fusion from L2-L5 with no change in alignment.   Electronically Signed   By: Dwyane Dee M.D.   On: 11/29/2013 08:05   Dg Lumbar Spine 2-3 Views  11/28/2013   CLINICAL DATA:  Posterior spinal fusion  EXAM: DG C-ARM GT 120 MIN; LUMBAR SPINE - 2-3 VIEW  COMPARISON:  None  FINDINGS: Frontal and lateral views were obtained. There is pedicle  screw fixation at L2, L3, L4, and L5 bilaterally. The screws and plates appear intact as do disc spacers at L2-3 and L3-4. No fracture or spondylolisthesis apparent.  IMPRESSION: Postoperative change with the fixation hardware and disc spacers appearing intact. No fracture or spondylolisthesis apparent.   Electronically Signed   By: Bretta Bang M.D.  On: 11/28/2013 18:48   Dg Lumbar Spine 1 View  11/28/2013   CLINICAL DATA:  68 year old female undergoing lumbar surgery. Initial encounter.  EXAM: LUMBAR SPINE - 1 VIEW  COMPARISON:  CT lumbar myelogram 10/05/2013.  FINDINGS: Intraoperative portable cross-table lateral view of the lumbar spine labeled #1 at 1255 hrs.  Same numbering system used as on the comparison. Previous L4-L5 and L5-S1 transpedicular fusion. New interbody implant at L2-L3 and L3-L4.  IMPRESSION: Same numbering system as on the 10/05/2013 CT lumbar myelogram. Previous L4-L5 and L5-S1 posterior fusion. New interbody implants at L2-L3 and L3-L4.   Electronically Signed   By: Augusto Gamble M.D.   On: 11/28/2013 14:14   Dg C-arm Gt 120 Min  11/28/2013   CLINICAL DATA:  Posterior spinal fusion  EXAM: DG C-ARM GT 120 MIN; LUMBAR SPINE - 2-3 VIEW  COMPARISON:  None  FINDINGS: Frontal and lateral views were obtained. There is pedicle screw fixation at L2, L3, L4, and L5 bilaterally. The screws and plates appear intact as do disc spacers at L2-3 and L3-4. No fracture or spondylolisthesis apparent.  IMPRESSION: Postoperative change with the fixation hardware and disc spacers appearing intact. No fracture or spondylolisthesis apparent.   Electronically Signed   By: Bretta Bang M.D.   On: 11/28/2013 18:48          Follow-up Information   Schedule an appointment as soon as possible for a visit with Alvy Beal, MD. (need return office visit 2 weeks postop)    Specialty:  Orthopedic Surgery   Contact information:   [address] [place] [address] [phone]         Discharge Plan:  discharge to SNF  Disposition:     Signed: Naida Sleight for Dr. Venita Lick [facility] [phone] 11/30/2013, 8:36 AM

## 2013-11-30 NOTE — Discharge Summary (Signed)
Agree with above 

## 2013-11-30 NOTE — Progress Notes (Signed)
Report called to SNF receiving patient. Patient D/C'd in stable condition with copy of chart.

## 2013-11-30 NOTE — Progress Notes (Signed)
    Subjective: Procedure(s) (LRB): XLIF L2-4  (N/A) POSTERIOR FUSION L2-5 WITH REMOVAL OF HARDWARE  (N/A) 2 Days Post-Op  Patient reports pain as 2 on 0-10 scale.  Reports decreased leg pain reports incisional back pain   Positive void Positive bowel movement Positive flatus Negative chest pain or shortness of breath  Objective: Vital signs in last 24 hours: Temp:  [98.8 F (37.1 C)-99.4 F (37.4 C)] 99.3 F (37.4 C) (09/11 0506) Pulse Rate:  [91-99] 99 (09/11 0506) Resp:  [16-18] 16 (09/11 0506) BP: (160-187)/(63-88) 160/63 mmHg (09/11 0506) SpO2:  [93 %-100 %] 93 % (09/11 0506)  Intake/Output from previous day: 09/10 0701 - 09/11 0700 In: 120 [P.O.:120] Out: -   Labs:  Recent Labs  11/28/13 1452 11/29/13 0625  WBC  --  13.3*  RBC  --  3.42*  HCT 34.0* 30.5*  PLT  --  262    Recent Labs  11/28/13 1452  NA 138  K 3.8  GLUCOSE 145*   No results found for this basename: LABPT, INR,  in the last 72 hours  Physical Exam: Neurologically intact ABD soft Intact pulses distally Dorsiflexion/Plantar flexion intact Incision: dressing C/D/I Compartment soft  Assessment/Plan: Patient stable  xrays satisfactory.  Improved sagittal balance.   Continue mobilization with physical therapy Continue care  Advance diet Up with therapy Discharge to SNF today if bed is ready  Venita Lick, MD [facility] [phone]

## 2013-11-30 NOTE — Plan of Care (Signed)
Problem: Consults Goal: Diagnosis - Spinal Surgery Outcome: Completed/Met Date Met:  11/30/13 Thoraco/Lumbar Spine Fusion L2-L4

## 2013-11-30 NOTE — Progress Notes (Signed)
Physical Therapy Treatment Patient Details Name: [name] MRN: [id number] DOB: 1945/04/16 Today's Date: 11/30/2013    History of Present Illness pt presents post L2-4 Fusion with hardware removal.      PT Comments    Patient progressing with ambulation this session. Patient up in recliner without brace on. Educated on brace being on when patient gets up. Patient having difficulty recall days and precautions. Planning to DC to SNF later today  Follow Up Recommendations  SNF     Equipment Recommendations  None recommended by PT    Recommendations for Other Services       Precautions / Restrictions Precautions Precautions: Back Precaution Comments: Patient able to recall 2/3 back precautions. Cues for no arching Spinal Brace: Lumbar corset;Applied in sitting position    Mobility  Bed Mobility               General bed mobility comments: Patient had just gotten up to recliner before session  Transfers Overall transfer level: Needs assistance Equipment used: Rolling walker (2 wheeled)   Sit to Stand: Min assist         General transfer comment: Patient with safe technique. Min A to ensure balance  Ambulation/Gait Ambulation/Gait assistance: Min guard Ambulation Distance (Feet): 100 Feet Assistive device: Rolling walker (2 wheeled) Gait Pattern/deviations: Step-through pattern;Decreased stride length     General Gait Details: pt moves slowly and demos good use of RW.    Stairs            Wheelchair Mobility    Modified Rankin (Stroke Patients Only)       Balance                                    Cognition Arousal/Alertness: Awake/alert Behavior During Therapy: Anxious Overall Cognitive Status: Within Functional Limits for tasks assessed       Memory: Decreased short-term memory;Decreased recall of precautions              Exercises      General Comments        Pertinent Vitals/Pain Pain Assessment:  No/denies pain    Home Living                      Prior Function            PT Goals (current goals can now be found in the care plan section) Progress towards PT goals: Progressing toward goals    Frequency  Min 5X/week    PT Plan Current plan remains appropriate    Co-evaluation             End of Session Equipment Utilized During Treatment: Gait belt;Back brace Activity Tolerance: Patient tolerated treatment well Patient left: in chair;with call bell/phone within reach;with family/visitor present     Time: 4782-9562 PT Time Calculation (min): 14 min  Charges:  $Gait Training: 8-22 mins                    G Codes:      Fredrich Birks 11/30/2013, 9:14 AM 11/30/2013 Fredrich Birks PTA [phone] pager [phone] office

## 2013-11-30 NOTE — Progress Notes (Signed)
Patient for discharge to SNF bed at [facility] today. Plans confirmed with patient and family who are in agreement. SNF is prepared for patient and we will  plan transfer via PTAR.   Sammie Denner, MSW Clinical Social Worker [phone]    

## 2013-12-04 ENCOUNTER — Encounter (HOSPITAL_COMMUNITY): Payer: Self-pay | Admitting: Emergency Medicine

## 2013-12-04 ENCOUNTER — Emergency Department (HOSPITAL_COMMUNITY): Payer: Medicare HMO

## 2013-12-04 ENCOUNTER — Inpatient Hospital Stay (HOSPITAL_COMMUNITY)
Admission: EM | Admit: 2013-12-04 | Discharge: 2013-12-07 | DRG: 948 | Disposition: A | Discharge status: Home Health | Payer: Medicare HMO | Attending: Internal Medicine | Admitting: Internal Medicine

## 2013-12-04 ENCOUNTER — Inpatient Hospital Stay (HOSPITAL_COMMUNITY): Payer: Medicare HMO

## 2013-12-04 DIAGNOSIS — E876 Hypokalemia: Secondary | ICD-10-CM | POA: Diagnosis present

## 2013-12-04 DIAGNOSIS — K5900 Constipation, unspecified: Secondary | ICD-10-CM | POA: Diagnosis present

## 2013-12-04 DIAGNOSIS — R32 Unspecified urinary incontinence: Secondary | ICD-10-CM | POA: Diagnosis not present

## 2013-12-04 DIAGNOSIS — F3289 Other specified depressive episodes: Secondary | ICD-10-CM | POA: Diagnosis present

## 2013-12-04 DIAGNOSIS — E669 Obesity, unspecified: Secondary | ICD-10-CM | POA: Diagnosis present

## 2013-12-04 DIAGNOSIS — E119 Type 2 diabetes mellitus without complications: Secondary | ICD-10-CM | POA: Diagnosis present

## 2013-12-04 DIAGNOSIS — Z96659 Presence of unspecified artificial knee joint: Secondary | ICD-10-CM

## 2013-12-04 DIAGNOSIS — I10 Essential (primary) hypertension: Secondary | ICD-10-CM | POA: Diagnosis present

## 2013-12-04 DIAGNOSIS — Z981 Arthrodesis status: Secondary | ICD-10-CM

## 2013-12-04 DIAGNOSIS — F329 Major depressive disorder, single episode, unspecified: Secondary | ICD-10-CM | POA: Diagnosis present

## 2013-12-04 DIAGNOSIS — E039 Hypothyroidism, unspecified: Secondary | ICD-10-CM | POA: Diagnosis present

## 2013-12-04 DIAGNOSIS — F22 Delusional disorders: Secondary | ICD-10-CM | POA: Diagnosis present

## 2013-12-04 DIAGNOSIS — F411 Generalized anxiety disorder: Secondary | ICD-10-CM | POA: Diagnosis present

## 2013-12-04 DIAGNOSIS — R159 Full incontinence of feces: Secondary | ICD-10-CM | POA: Diagnosis not present

## 2013-12-04 DIAGNOSIS — Z6841 Body Mass Index (BMI) 40.0 and over, adult: Secondary | ICD-10-CM

## 2013-12-04 DIAGNOSIS — K219 Gastro-esophageal reflux disease without esophagitis: Secondary | ICD-10-CM | POA: Diagnosis present

## 2013-12-04 DIAGNOSIS — M129 Arthropathy, unspecified: Secondary | ICD-10-CM | POA: Diagnosis present

## 2013-12-04 DIAGNOSIS — R4182 Altered mental status, unspecified: Principal | ICD-10-CM | POA: Diagnosis present

## 2013-12-04 LAB — COMPREHENSIVE METABOLIC PANEL
ALT: 51 U/L — AB (ref 0–35)
AST: 47 U/L — ABNORMAL HIGH (ref 0–37)
Albumin: 3.5 g/dL (ref 3.5–5.2)
Alkaline Phosphatase: 84 U/L (ref 39–117)
Anion gap: 17 — ABNORMAL HIGH (ref 5–15)
BUN: 12 mg/dL (ref 6–23)
CALCIUM: 9.4 mg/dL (ref 8.4–10.5)
CO2: 26 mEq/L (ref 19–32)
Chloride: 97 mEq/L (ref 96–112)
Creatinine, Ser: 0.87 mg/dL (ref 0.50–1.10)
GFR calc Af Amer: 76 mL/min — ABNORMAL LOW (ref >90)
GFR, EST NON AFRICAN AMERICAN: 66 mL/min — AB (ref >90)
GLUCOSE: 155 mg/dL — AB (ref 70–99)
Potassium: 3.4 mEq/L — ABNORMAL LOW (ref 3.7–5.3)
Sodium: 140 mEq/L (ref 137–147)
Total Bilirubin: 1 mg/dL (ref 0.3–1.2)
Total Protein: 7.5 g/dL (ref 6.0–8.3)

## 2013-12-04 LAB — TSH: TSH: 2.4 u[IU]/mL (ref 0.350–4.500)

## 2013-12-04 LAB — CBC
HEMATOCRIT: 32.3 % — AB (ref 36.0–46.0)
HEMOGLOBIN: 10.8 g/dL — AB (ref 12.0–15.0)
MCH: 29.5 pg (ref 26.0–34.0)
MCHC: 33.4 g/dL (ref 30.0–36.0)
MCV: 88.3 fL (ref 78.0–100.0)
Platelets: 407 10*3/uL — ABNORMAL HIGH (ref 150–400)
RBC: 3.66 MIL/uL — ABNORMAL LOW (ref 3.87–5.11)
RDW: 13.5 % (ref 11.5–15.5)
WBC: 12.6 10*3/uL — ABNORMAL HIGH (ref 4.0–10.5)

## 2013-12-04 LAB — I-STAT ARTERIAL BLOOD GAS, ED
Bicarbonate: 23.7 mEq/L (ref 20.0–24.0)
O2 SAT: 95 %
Patient temperature: 101
TCO2: 25 mmol/L (ref 0–100)
pCO2 arterial: 34.7 mmHg — ABNORMAL LOW (ref 35.0–45.0)
pH, Arterial: 7.448 (ref 7.350–7.450)
pO2, Arterial: 79 mmHg — ABNORMAL LOW (ref 80.0–100.0)

## 2013-12-04 LAB — URINALYSIS, ROUTINE W REFLEX MICROSCOPIC
Bilirubin Urine: NEGATIVE
Glucose, UA: NEGATIVE mg/dL
HGB URINE DIPSTICK: NEGATIVE
Ketones, ur: 15 mg/dL — AB
Leukocytes, UA: NEGATIVE
Nitrite: NEGATIVE
PROTEIN: NEGATIVE mg/dL
Specific Gravity, Urine: 1.015 (ref 1.005–1.030)
UROBILINOGEN UA: 0.2 mg/dL (ref 0.0–1.0)
pH: 7.5 (ref 5.0–8.0)

## 2013-12-04 LAB — CBG MONITORING, ED
GLUCOSE-CAPILLARY: 113 mg/dL — AB (ref 70–99)
Glucose-Capillary: 142 mg/dL — ABNORMAL HIGH (ref 70–99)

## 2013-12-04 LAB — RAPID URINE DRUG SCREEN, HOSP PERFORMED
Amphetamines: NOT DETECTED
BARBITURATES: NOT DETECTED
Benzodiazepines: NOT DETECTED
Cocaine: NOT DETECTED
Opiates: NOT DETECTED
TETRAHYDROCANNABINOL: NOT DETECTED

## 2013-12-04 LAB — AMMONIA: Ammonia: 16 umol/L (ref 11–60)

## 2013-12-04 LAB — I-STAT TROPONIN, ED: Troponin i, poc: 0.02 ng/mL (ref 0.00–0.08)

## 2013-12-04 LAB — I-STAT CG4 LACTIC ACID, ED: Lactic Acid, Venous: 1.48 mmol/L (ref 0.5–2.2)

## 2013-12-04 MED ORDER — SODIUM CHLORIDE 0.9 % IV SOLN
INTRAVENOUS | Status: AC
  Administered 2013-12-04: 20:00:00 via INTRAVENOUS

## 2013-12-04 MED ORDER — HEPARIN SODIUM (PORCINE) 5000 UNIT/ML IJ SOLN
5000.0000 [IU] | Freq: Three times a day (TID) | INTRAMUSCULAR | Status: DC
  Administered 2013-12-05 – 2013-12-07 (×7): 5000 [IU] via SUBCUTANEOUS
  Filled 2013-12-04 (×10): qty 1

## 2013-12-04 MED ORDER — SODIUM CHLORIDE 0.9 % IV SOLN
2000.0000 mg | Freq: Once | INTRAVENOUS | Status: AC
  Administered 2013-12-04: 2000 mg via INTRAVENOUS
  Filled 2013-12-04: qty 2000

## 2013-12-04 MED ORDER — VANCOMYCIN HCL IN DEXTROSE 1-5 GM/200ML-% IV SOLN
1000.0000 mg | Freq: Two times a day (BID) | INTRAVENOUS | Status: DC
  Administered 2013-12-05: 1000 mg via INTRAVENOUS
  Filled 2013-12-04 (×2): qty 200

## 2013-12-04 MED ORDER — PIPERACILLIN-TAZOBACTAM 3.375 G IVPB
3.3750 g | Freq: Three times a day (TID) | INTRAVENOUS | Status: DC
  Administered 2013-12-04 – 2013-12-05 (×2): 3.375 g via INTRAVENOUS
  Filled 2013-12-04 (×4): qty 50

## 2013-12-04 MED ORDER — VANCOMYCIN HCL IN DEXTROSE 1-5 GM/200ML-% IV SOLN: 1000.0000 mg | Freq: Once | INTRAVENOUS | Status: DC

## 2013-12-04 MED ORDER — PIPERACILLIN-TAZOBACTAM 3.375 G IVPB 30 MIN
3.3750 g | Freq: Once | INTRAVENOUS | Status: AC
  Administered 2013-12-04: 3.375 g via INTRAVENOUS
  Filled 2013-12-04: qty 50

## 2013-12-04 MED ORDER — METOPROLOL TARTRATE 1 MG/ML IV SOLN: 5.0000 mg | INTRAVENOUS | Status: DC | PRN

## 2013-12-04 MED ORDER — POTASSIUM CHLORIDE 10 MEQ/100ML IV SOLN: 10.0000 meq | INTRAVENOUS | Status: AC

## 2013-12-04 MED ORDER — ACETAMINOPHEN 500 MG PO TABS
1000.0000 mg | ORAL_TABLET | Freq: Four times a day (QID) | ORAL | Status: DC | PRN
  Administered 2013-12-06: 1000 mg via ORAL
  Administered 2013-12-06 – 2013-12-07 (×2): 500 mg via ORAL
  Filled 2013-12-04 (×3): qty 2

## 2013-12-04 MED ORDER — ACETAMINOPHEN 650 MG RE SUPP: 650.0000 mg | Freq: Four times a day (QID) | RECTAL | Status: DC | PRN

## 2013-12-04 MED ORDER — FENTANYL CITRATE 0.05 MG/ML IJ SOLN: 50.0000 ug | Freq: Once | INTRAMUSCULAR | Status: DC

## 2013-12-04 MED ORDER — ASPIRIN EC 81 MG PO TBEC
81.0000 mg | DELAYED_RELEASE_TABLET | Freq: Every day | ORAL | Status: DC
  Administered 2013-12-07: 81 mg via ORAL
  Filled 2013-12-04 (×3): qty 1

## 2013-12-04 MED ORDER — SODIUM CHLORIDE 0.9 % IV BOLUS (SEPSIS)
2000.0000 mL | Freq: Once | INTRAVENOUS | Status: AC
  Administered 2013-12-04: 2000 mL via INTRAVENOUS

## 2013-12-04 NOTE — ED Notes (Signed)
PA at the bedside.

## 2013-12-04 NOTE — ED Notes (Signed)
Per pt's daughter - pt had sx on Wednesday was in hospital until Friday and started to have hallucinations while in the hospital. Pt d/c to [facility], pt continued to have some hallucinations. Pt called daughter in the middle of the night and saying she was seeing things. Daughter sts she went to see her the next day and the pt was doing very well but didn't like staying at the facility. So she took the pt home and had home health care set up. Today when the pt woke up she had garbled speech. Pt able to communicate with family and aware of her surroundings. Daughter concerned about pt still having hallucinations.

## 2013-12-04 NOTE — Progress Notes (Signed)
ANTIBIOTIC CONSULT NOTE - INITIAL  Pharmacy Consult for vancomycin and Zosyn Indication: rule out sepsis  No Known Allergies  Patient Measurements: Height:  (157.5 cm) Weight: 231 lb (104.781 kg) IBW/kg (Calculated) : 50.1  Vital Signs: Temp: 100.8 F (38.2 C) (09/15 1503) Temp src: Rectal (09/15 1503) BP: 141/65 mmHg (09/15 1515) Pulse Rate: 93 (09/15 1515) Intake/Output from previous day:   Intake/Output from this shift:    Labs:  Recent Labs  12/04/13 1217  WBC 12.6*  HGB 10.8*  PLT 407*  CREATININE 0.87   Estimated Creatinine Clearance: 68.4 ml/min (by C-G formula based on Cr of 0.87). No results found for this basename: VANCOTROUGH, Leodis Binet, VANCORANDOM, GENTTROUGH, GENTPEAK, GENTRANDOM, TOBRATROUGH, TOBRAPEAK, TOBRARND, AMIKACINPEAK, AMIKACINTROU, AMIKACIN,  in the last 72 hours   Microbiology: Recent Results (from the past 720 hour(s))  SURGICAL PCR SCREEN     Status: None   Collection Time    11/22/13  3:48 PM      Result Value Ref Range Status   MRSA, PCR NEGATIVE  NEGATIVE Final   Staphylococcus aureus NEGATIVE  NEGATIVE Final   Comment:            The Xpert SA Assay (FDA     approved for NASAL specimens     in patients over 63 years of age),     is one component of     a comprehensive surveillance     program.  Test performance has     been validated by [employer] for patients greater     than or equal to 77 year old.     It is not intended     to diagnose infection nor to     guide or monitor treatment.    Medical History: Past Medical History  Diagnosis Date  . Hypertension   . Shortness of breath   . Depression   . Hypothyroidism   . Anxiety   . GERD (gastroesophageal reflux disease)   . History of blood transfusion 2004    "related to back OR"  . Arthritis     "multiple places; hands, feet, etc." (11/29/2013)  . Chronic lower back pain     Medications:  See medication history list Assessment: 68 year old woman  with altered mental status s/p back surgery 6 days ago.  Tmax 100.8 in the ED.  Vancomycin and Zosyn to start for possible sepsis  Goal of Therapy:  Vancomycin trough level 15-20 mcg/ml  Plan:  Zosyn 3.375g IV q8h (infuse over 4 hours) Vancomycin 2g IV x 1 dose, then 1g IV q12 Follow renal function and culture results  Mickeal Skinner 12/04/2013,3:39 PM

## 2013-12-04 NOTE — ED Notes (Signed)
Phlebotomy at the bedside obtaining second blood cultures.

## 2013-12-04 NOTE — ED Notes (Signed)
Patient returned from MRI.

## 2013-12-04 NOTE — ED Notes (Signed)
Patient returned from CT. Patient quiet and less responsive than before. MD made aware. MD at bedside. Patient neuro check completed. Generalized weakness.

## 2013-12-04 NOTE — ED Notes (Signed)
Nurse walked to MRI with patient. Patient's family reports patient is complaining of generalized pain. When asked about pain, Patient does not answer. Patient's eye are closed and blinking. Attempted arm drop in front of patient's face, patient moved arm and kept arm from hitting face. Patient slightly followed commands with grasping both hands and had strong foot placement. Patient finally acknowledge MRI staff when told she was going to be moved over to the bed. Patient's VS are Stable and WNL.

## 2013-12-04 NOTE — ED Provider Notes (Signed)
CSN: [id number]     Arrival date & time 12/04/13  1153 History   First MD Initiated Contact with Patient 12/04/13 1304     Chief Complaint  Patient presents with  . Altered Mental Status     HPI 68 year old female with a history of DM, HTN, and hypothyroidism was brought to the ED by her daughters due to altered mental status. Patient underwent spinal surgery on Wednesday, 11/28/2013, which required prolonged anesthesia. Per surgical note there were no surgical complications. Patient recovered in the hospital without complications and was transferred to a rehabilitation facility on Friday afternoon. Per daughters, patient started experiencing some visual hallucinations, but was alert and was fully communicative on Saturday. The family was reassured by the Rehab Facility staff and contact at the surgeon's office, that this was an expected post-operative complication. Over the weekend, patient continued to have hallucinations, difficulty sleeping and repeatedly called her daughters to be removed from the facility. On Monday, 12/03/2013, patient was taken home and was eating and communicating well, and continued to have hallucinations. This morning, was woken up by daughter due to sleeping too long, and has been having periods of reduced responsiveness, incomprehensible speech, hallucinations and delusions. Information was partially provided by patient and supplemented by her daughters. Deny any fevers, diarrhea, vomiting or nausea, chest pain, SOB, headache, changes in vision, weakness, paresthesia or anesthesia, diaphoresis, abdominal pain, urinary retention or dysuria. Postoperatively patient experienced constipation, successfully treated with stool softener, LBM 12/03/2013. Current treatments include usual antihypertensives and thyroid replacement, per daughters no doses have been missed. Patient received Percoset post-operatively, but stopped treatment on on Sunday, stating the medication "made her feel  funny". Has been eating and hydrating well.  Past Medical History  Diagnosis Date  . Hypertension   . Shortness of breath   . Depression   . Hypothyroidism   . Anxiety   . GERD (gastroesophageal reflux disease)   . History of blood transfusion 2004    "related to back OR"  . Arthritis     "multiple places; hands, feet, etc." (11/29/2013)  . Chronic lower back pain    Past Surgical History  Procedure Laterality Date  . Back surgery    . Carpal tunnel release Bilateral   . Posterior lumbar fusion  11/28/2013    "hardware removal; ~ 6 screws and 2 rods"  . Reduction mammaplasty    . Excisional hemorrhoidectomy    . Joint replacement    . Total knee arthroplasty Bilateral   . Tubal ligation    . Abdominal hysterectomy      partial  . Posterior lumbar fusion  2004    "placed screws and rods"  . Cataract extraction w/ intraocular lens  implant, bilateral Bilateral    No family history on file. History  Substance Use Topics  . Smoking status: Never Smoker   . Smokeless tobacco: Never Used  . Alcohol Use: No   OB History   Grav Para Term Preterm Abortions TAB SAB Ect Mult Living                 Review of Systems  Unable to perform ROS: Mental status change  Gastrointestinal: Positive for constipation.  Neurological:       + altered mental status, repeating "it's a set up" and referencing God,  +incomprehensible speech  Psychiatric/Behavioral: Positive for hallucinations.      Allergies  Review of patient's allergies indicates no known allergies.  Home Medications   Prior to Admission  medications   Medication Sig Start Date End Date Taking? Authorizing Provider  ALPRAZolam Prudy Feeler) 0.5 MG tablet Take 0.5 mg by mouth 3 (three) times daily as needed for anxiety.   Yes Historical Provider, MD  Calcium Carb-Cholecalciferol (CALCIUM 500 +D) 500-400 MG-UNIT TABS Take 1 tablet by mouth daily.   Yes Historical Provider, MD  docusate sodium (COLACE) 100 MG capsule Take 1  capsule (100 mg total) by mouth 2 (two) times daily. 11/30/13  Yes Zonia Kief, PA-C  famotidine (PEPCID) 20 MG tablet Take 20 mg by mouth daily as needed for heartburn or indigestion.   Yes Historical Provider, MD  levothyroxine (SYNTHROID, LEVOTHROID) 75 MCG tablet Take 75 mcg by mouth daily before breakfast.   Yes Historical Provider, MD  magnesium oxide (MAG-OX) 400 MG tablet Take 400 mg by mouth daily.   Yes Historical Provider, MD  methocarbamol (ROBAXIN) 500 MG tablet Take 1 tablet (500 mg total) by mouth every 6 (six) hours as needed for muscle spasms. 11/30/13  Yes Zonia Kief, PA-C  olmesartan-hydrochlorothiazide (BENICAR HCT) 40-25 MG per tablet Take 1 tablet by mouth daily.   Yes Historical Provider, MD  omeprazole (PRILOSEC) 20 MG capsule Take 20 mg by mouth daily.   Yes Historical Provider, MD  ondansetron (ZOFRAN ODT) 4 MG disintegrating tablet Take 1 tablet (4 mg total) by mouth every 8 (eight) hours as needed. 11/30/13  Yes Zonia Kief, PA-C  oxyCODONE-acetaminophen (PERCOCET) 10-325 MG per tablet Take 1 tablet by mouth every 6 (six) hours as needed for pain. 11/30/13  Yes Zonia Kief, PA-C  polyethylene glycol (MIRALAX / GLYCOLAX) packet Take 17 g by mouth daily. 11/30/13  Yes Zonia Kief, PA-C   BP 131/62  Pulse 96  Temp(Src) 98.3 F (36.8 C) (Oral)  Resp 21  Ht  (1.575 m)  Wt 231 lb (104.781 kg)  BMI 42.24 kg/m2  SpO2 98% Physical Exam  Nursing note and vitals reviewed. Constitutional: She is oriented to person, place, and time. She appears well-developed and well-nourished. She is uncooperative. She is easily aroused.  Non-toxic appearance.  Patient is an obese female, laying with her eyes closed.  HENT:  Head: Normocephalic and atraumatic.  Mouth/Throat: Oropharynx is clear and moist.  Eyes: Conjunctivae and EOM are normal. Pupils are equal, round, and reactive to light.  Neck: Normal range of motion. Neck supple.  Cardiovascular: Normal rate, regular rhythm and  intact distal pulses.  Exam reveals no gallop and no friction rub.   Murmur heard.  Systolic murmur is present with a grade of 3/6  Pulmonary/Chest: Effort normal and breath sounds normal. No respiratory distress. She exhibits no tenderness.  Abdominal: Soft. Bowel sounds are normal. She exhibits no distension. There is no tenderness. There is no rebound and no guarding.  Lymphadenopathy:    She has no cervical adenopathy.  Neurological: She is alert, oriented to person, place, and time and easily aroused. She has normal strength. No cranial nerve deficit or sensory deficit. GCS eye subscore is 4. GCS verbal subscore is 4. GCS motor subscore is 6.  Skin: Skin is warm and dry. She is not diaphoretic.  3 large incisions on lower back and one on left side well-appearing, no signs of infection.  Psychiatric: She is withdrawn. Thought content is delusional.  Incomprehensible remarks; frequently repeating "it's a set up" and making references to God.  Patient refuses to obey commands at points of physical exam, while being compliant during others.     ED Course  Procedures (  including critical care time) Labs Review Labs Reviewed  CBC - Abnormal; Notable for the following:    WBC 12.6 (*)    RBC 3.66 (*)    Hemoglobin 10.8 (*)    HCT 32.3 (*)    Platelets 407 (*)    All other components within normal limits  COMPREHENSIVE METABOLIC PANEL - Abnormal; Notable for the following:    Potassium 3.4 (*)    Glucose, Bld 155 (*)    AST 47 (*)    ALT 51 (*)    GFR calc non Af Amer 66 (*)    GFR calc Af Amer 76 (*)    Anion gap 17 (*)    All other components within normal limits  CBG MONITORING, ED - Abnormal; Notable for the following:    Glucose-Capillary 142 (*)    All other components within normal limits  CULTURE, BLOOD (ROUTINE X 2)  CULTURE, BLOOD (ROUTINE X 2)  URINALYSIS, ROUTINE W REFLEX MICROSCOPIC  I-STAT CG4 LACTIC ACID, ED    Imaging Review No results found.   EKG  Interpretation   Date/Time:  Tuesday December 04 2013 12:10:03 EDT Ventricular Rate:  114 PR Interval:  146 QRS Duration: 86 QT Interval:  362 QTC Calculation: 498 R Axis:   94 Text Interpretation:  Sinus tachycardia Possible Left atrial enlargement  Rightward axis Cannot rule out Anterior infarct , age undetermined  Abnormal ECG ST/T changes improved from 2006 Confirmed by GOLDSTON  MD,  SCOTT [phone]) on 12/04/2013 1:51:38 PM      MDM   Final diagnoses:  Altered mental status, unspecified altered mental status type   Pt presenting with altered mental status 6 days post-op back surgery. She is non-toxic appearing, AAOx3, however mutters occasional incomprehensible sounds. AFVSS. Denies any pain. Delirium vs CVA vs AMS from infection. Leukocytosis of 12.6. UA pending.  3:24 PM Temp increased to 100.8. Will start broad spectrum antibiotics- vanc and zosyn. Head CT, UA pending.  I spoke with Zonia Kief, PA-C working with Dr. Shon Baton (physician who performed back surgery) who will speak with Dr. Shon Baton regarding pt being in the ED and possible concern of infection. 4:30 PM UA negative. CXR pending. Plan to admit. Have not heard back from Dr. Shon Baton at this time. Pt signed out to Dr. Manus Gunning at shift change.  Case discussed with attending Dr. Criss Alvine who also evaluated patient and agrees with plan of care.   Trevor Mace, PA-C 12/05/13 1610

## 2013-12-04 NOTE — ED Notes (Signed)
Called Lab. Patient's urine is being completed now.

## 2013-12-04 NOTE — ED Notes (Signed)
Attempted to Call Report x1.  

## 2013-12-04 NOTE — H&P (Signed)
Date: 12/04/2013               Patient Name:  Sierra [name] MRN: [id number]  DOB: 1945/12/21 Age / Sex: 68 y.o., female   PCP: Philemon Kingdom, MD         Medical Service: Internal Medicine Teaching Service         Attending Physician: Dr. Burns Spain, MD    First Contact: Dr. Eleonore Chiquito Pager: [phone]  Second Contact: Dr. Charlsie Merles Pager: [phone]       After Hours (After 5p/  First Contact Pager: [phone]  weekends / holidays): Second Contact Pager: [phone]   Chief Complaint: increased sleepiness and new onset hallucinations over Sierra past 4 days  History of Present Illness:  Sierra [name] is a 68 yo woman here with altered mental status and new onset hallucinations several days after a recent hospitalization for a spinal fusion for treatment of her lumbar stenosis. She was admitted for surgery on 11/28/13 and experienced some vague hallucinations after Sierra surgery while still in [facility] hospital on 11/29/13 and 11/30/13 that were attributed to Sierra anesthesia. Her pain was treated with percocet.  She was then transferred to a SNF on 9/11. That night, she called her daughter, reporting worsened hallucinations and Sierra feeling that people at [facility] were "out to get her". Her daughter proceeded to take her out of [facility] a day later; she therefore spent last night at home. She slept for an abnormally long time last night and was then mumbling and difficult to follow upon awakening this morning. Per her [name], she has become more and more disoriented and her mumbling has worsened since this morning. She has denied any pain or other symptoms throughout. Her last dose of percocet was on 12/01/13.  Of note, her [name] add that she was very unhappy to be placed in [facility] nursing home area [facility], rather than in [facility] rehab hall.   Meds: Current Facility-Administered Medications  Medication Dose Route Frequency Provider Last Rate Last Dose  . piperacillin-tazobactam (ZOSYN) IVPB  3.375 g  3.375 g Intravenous 3 times per day Audree Camel, MD      . vancomycin (VANCOCIN) 2,000 mg in sodium chloride 0.9 % 500 mL IVPB  2,000 mg Intravenous Once Audree Camel, MD      . vancomycin (VANCOCIN) IVPB 1000 mg/200 mL premix  1,000 mg Intravenous BID Audree Camel, MD       Current Outpatient Prescriptions  Medication Sig Dispense Refill  . ALPRAZolam (XANAX) 0.5 MG tablet Take 0.5 mg by mouth 3 (three) times daily as needed for anxiety.      . Calcium Carb-Cholecalciferol (CALCIUM 500 +D) 500-400 MG-UNIT TABS Take 1 tablet by mouth daily.      Marland Kitchen docusate sodium (COLACE) 100 MG capsule Take 1 capsule (100 mg total) by mouth 2 (two) times daily.  50 capsule  0  . famotidine (PEPCID) 20 MG tablet Take 20 mg by mouth daily as needed for heartburn or indigestion.      Marland Kitchen levothyroxine (SYNTHROID, LEVOTHROID) 75 MCG tablet Take 75 mcg by mouth daily before breakfast.      . magnesium oxide (MAG-OX) 400 MG tablet Take 400 mg by mouth daily.      . methocarbamol (ROBAXIN) 500 MG tablet Take 1 tablet (500 mg total) by mouth every 6 (six) hours as needed for muscle spasms.      Marland Kitchen olmesartan-hydrochlorothiazide (BENICAR HCT) 40-25 MG per  tablet Take 1 tablet by mouth daily.      Marland Kitchen omeprazole (PRILOSEC) 20 MG capsule Take 20 mg by mouth daily.      . ondansetron (ZOFRAN ODT) 4 MG disintegrating tablet Take 1 tablet (4 mg total) by mouth every 8 (eight) hours as needed.  40 tablet  0  . oxyCODONE-acetaminophen (PERCOCET) 10-325 MG per tablet Take 1 tablet by mouth every 6 (six) hours as needed for pain.  60 tablet  0  . polyethylene glycol (MIRALAX / GLYCOLAX) packet Take 17 g by mouth daily.  30 each  0    Allergies: Allergies as of 12/04/2013  . (No Known Allergies)   Past Medical History  Diagnosis Date  . Hypertension   . Shortness of breath   . Depression   . Hypothyroidism   . Anxiety   . GERD (gastroesophageal reflux disease)   . History of blood transfusion 2004     "related to back OR"  . Arthritis     "multiple places; hands, feet, etc." (11/29/2013)  . Chronic lower back pain    Past Surgical History  Procedure Laterality Date  . Back surgery    . Carpal tunnel release Bilateral   . Posterior lumbar fusion  11/28/2013    "hardware removal; ~ 6 screws and 2 rods"  . Reduction mammaplasty    . Excisional hemorrhoidectomy    . Joint replacement    . Total knee arthroplasty Bilateral   . Tubal ligation    . Abdominal hysterectomy      partial  . Posterior lumbar fusion  2004    "placed screws and rods"  . Cataract extraction w/ intraocular lens  implant, bilateral Bilateral    No family history on file. History   Social History  . Marital Status: Single    Spouse Name: N/A    Number of Children: N/A  . Years of Education: N/A   Occupational History  . Not on file.   Social History Main Topics  . Smoking status: Never Smoker   . Smokeless tobacco: Never Used  . Alcohol Use: No  . Drug Use: No  . Sexual Activity: No   Other Topics Concern  . Not on file   Social History Narrative  . No narrative on file    Review of Systems: General: recent surgery, otherwise no recent illness Skin: no rashes or lesions (other than lumbar surgical site) HEENT: no headache, no vision changes, no hearing changes Cardiac: no chest pain or palpitations Respiratory: no shortness of breath or wheezing GI: no changes in BMs, no nausea or vomiting Urinary: no dysuria, hematuria or frequency Msk: no swelling or joint pain, history of joint replacements and recent spinal fusion  Endocrine: no temperature intolerance, no weight change Psychiatric: has seen bats in her hospital room (on last admission), felt like ceiling was falling at SNF, said workers were "out to get me" at [facility]  Physical Exam: Blood pressure 142/66, pulse 86, temperature 101.1 F (38.4 C), temperature source Rectal, resp. rate 22, height  (1.575 m), weight 231 lb (104.781 kg),  SpO2 98.00%. Appearance: lying in bed with eyes closed, 3 anxious, teary [name] at bedside HEENT: AT/[place], PERRL, no nystagmus, EOMi, pterygium medially OS, moist mucous membranes, no lymphadenopathy Heart: RRR, normal S1S2, no MRG Lungs: CTAB, shallow, rapid breathing when upset Abdomen: BS+, soft, slightly tender in RLQ on deep palpation Musculoskeletal: no joint swelling or tenderness Extremities: no edema in lower extremities Neurologic: tongue has rapid  movements up and down (no palatal nystagmus), A&Ox2 (could not identify Sierra date), CN II-XII intact, FNF intact, strength and sensation intact, did not observe gait Psychiatric: appears upset, occasionally bursts out that "you (her daughter) should have gotten me (from [facility]) earlier!" Skin: no rashes, well-healing wound, noneyrthematous, mildly tender  Lab results: Basic Metabolic Panel:  Recent Labs  40/98/11 1217  NA 140  K 3.4*  CL 97  CO2 26  GLUCOSE 155*  BUN 12  CREATININE 0.87  CALCIUM 9.4   Liver Function Tests:  Recent Labs  12/04/13 1217  AST 47*  ALT 51*  ALKPHOS 84  BILITOT 1.0  PROT 7.5  ALBUMIN 3.5   CBC:  Recent Labs  12/04/13 1217  WBC 12.6*  HGB 10.8*  HCT 32.3*  MCV 88.3  PLT 407*   CCBG:  Recent Labs  12/04/13 1317 12/04/13 1649  GLUCAP 142* 113*   Urine Drug Screen: Drugs of Abuse     Component Value Date/Time   LABOPIA NONE DETECTED 12/04/2013 1503   COCAINSCRNUR NONE DETECTED 12/04/2013 1503   LABBENZ NONE DETECTED 12/04/2013 1503   AMPHETMU NONE DETECTED 12/04/2013 1503   THCU NONE DETECTED 12/04/2013 1503   LABBARB NONE DETECTED 12/04/2013 1503    Urinalysis:  Recent Labs  12/04/13 1503  COLORURINE YELLOW  LABSPEC 1.015  PHURINE 7.5  GLUCOSEU NEGATIVE  HGBUR NEGATIVE  BILIRUBINUR NEGATIVE  KETONESUR 15*  PROTEINUR NEGATIVE  UROBILINOGEN 0.2  NITRITE NEGATIVE  LEUKOCYTESUR NEGATIVE   Imaging results:  Ct Head Wo Contrast  12/04/2013   CLINICAL DATA:   Altered mental status  EXAM: CT HEAD WITHOUT CONTRAST  TECHNIQUE: Contiguous axial images were obtained from Sierra base of Sierra skull through Sierra vertex without intravenous contrast.  COMPARISON:  12/21/2006  FINDINGS: Mild global atrophy. No mass effect, midline shift, or acute intracranial hemorrhage. Cranium is intact mastoid air cells are clear. Minimal mucus in Sierra sphenoid sinus  IMPRESSION: No acute intracranial pathology.   Electronically Signed   By: Maryclare Bean M.D.   On: 12/04/2013 16:32   Dg Chest Portable 1 View  12/04/2013   CLINICAL DATA:  Altered mental status. Hallucinations. Back surgery 1 week ago.  EXAM: PORTABLE CHEST -  COMPARISON:  None.  FINDINGS: Cardiopericardial silhouette within normal limits. Mediastinal contours normal. Trachea midline. No airspace disease or effusion. Low volume chest. Monitoring leads project over Sierra chest.  IMPRESSION: No active disease.   Electronically Signed   By: Andreas Newport M.D.   On: 12/04/2013 17:00    Other results: EKG: Tachycardic to 114, normal sinus rhythm, improved ST changes from 2006 EKG.  Assessment & Plan by Problem: Active Problems:   Altered mental status Sierra [name] is a 68 yo woman who was recently hospitalized for a lumbar spinal fusion surgery, then slowly developed altered mental status. Her [name] report that she has had visual hallucinations and that she has been speaking frequently about what sounds like a traumatic stay at her SNF. She was oriented x2 on exam, but also appeared fixated on her time at [facility] on exam. She has a fever with leukocytosis to 12.6, but her urinalysis and chest xray have found no source of infection. Her orthopaedic surgeon does not believe her surgery to have caused infection; her CT lumbar spine is pending. Her head CT was negative. Her lactic acid, ammonia and TSH are WNL.   Altered Mental Status: Continue to look for a cause of Sierra AMS with urine and  blood cultures (to investigate Sierra  leukocytosis to 12.6 with negative UA, CXR) and brain imaging along with labs. - MRI / MRA brain without contrast - Neuro checks q4 hours; notify MD of MS changes - Continue zosyn and vancomycin - Blood cultures x2 pending - Start aspirin - Metoprolol PRN - Lipid panel pending - Strict I's & O's - HgbA1c - Tele - Maintenance NS IVF 75 cc/hr  Hallucinations:  - Reorient frequently - Position patient so she can see window  Recent Lumbar Spinal Stenosis:  - Lumbar CT without contrast to rule out wound infection - Wound care - Orthopaedics consulted  Hypokalemia: 3.4 - potassium chloride 10 meq in 100 mL IVF x2  Code Status: - Confirm code status tomorrow (was formerly full)   Diet:  - NPO until mental status improved  DVT Ppx: - [place] heparin  Dispo: Disposition is deferred at this time, awaiting improvement of current medical problems. Anticipated discharge in approximately 2 day(s).   Sierra patient does have a current PCP Philemon Kingdom, MD) and does not need an [facility] hospital follow-up appointment after discharge.  Sierra patient does not have transportation limitations that hinder transportation to clinic appointments.  Signed: Dionne Ano, MD 12/04/2013, 6:03 PM

## 2013-12-04 NOTE — ED Provider Notes (Signed)
Medical screening examination/treatment/procedure(s) were conducted as a shared visit with non-physician practitioner(s) and myself.  I personally evaluated the patient during the encounter.  Assumed care from [facility]. AMS after back surgery. CT head negative. Urinalysis negative. Wounds appear to be healing well.  Patient remains altered and minimally communicative. She follows commands intermittently. No meningismus. Febrile to 100.8. CXR and UA negative.  Discussed with Dr. Shon Baton. He does not feel this is a postoperative infection. He agrees with CT of the lumbar spine he will evaluate patient. He is worried for other causes of altered mental status.   EKG Interpretation   Date/Time:  Tuesday December 04 2013 12:10:03 EDT Ventricular Rate:  114 PR Interval:  146 QRS Duration: 86 QT Interval:  362 QTC Calculation: 498 R Axis:   94 Text Interpretation:  Sinus tachycardia Possible Left atrial enlargement  Rightward axis Cannot rule out Anterior infarct , age undetermined  Abnormal ECG ST/T changes improved from 2006 Confirmed by GOLDSTON  MD,  SCOTT [phone]) on 12/04/2013 1:51:38 PM        Glynn Octave, MD 12/04/13 2316

## 2013-12-04 NOTE — ED Notes (Signed)
  CBG 142  

## 2013-12-05 ENCOUNTER — Inpatient Hospital Stay (HOSPITAL_COMMUNITY): Payer: Medicare HMO

## 2013-12-05 ENCOUNTER — Encounter (HOSPITAL_COMMUNITY): Payer: Self-pay

## 2013-12-05 DIAGNOSIS — R443 Hallucinations, unspecified: Secondary | ICD-10-CM

## 2013-12-05 DIAGNOSIS — E876 Hypokalemia: Secondary | ICD-10-CM

## 2013-12-05 DIAGNOSIS — R4182 Altered mental status, unspecified: Principal | ICD-10-CM

## 2013-12-05 LAB — CBC WITH DIFFERENTIAL/PLATELET
Basophils Absolute: 0 10*3/uL (ref 0.0–0.1)
Basophils Relative: 0 % (ref 0–1)
EOS ABS: 0.2 10*3/uL (ref 0.0–0.7)
Eosinophils Relative: 2 % (ref 0–5)
HEMATOCRIT: 29 % — AB (ref 36.0–46.0)
HEMOGLOBIN: 9.6 g/dL — AB (ref 12.0–15.0)
LYMPHS ABS: 2.1 10*3/uL (ref 0.7–4.0)
Lymphocytes Relative: 18 % (ref 12–46)
MCH: 29 pg (ref 26.0–34.0)
MCHC: 33.1 g/dL (ref 30.0–36.0)
MCV: 87.6 fL (ref 78.0–100.0)
MONO ABS: 0.7 10*3/uL (ref 0.1–1.0)
MONOS PCT: 6 % (ref 3–12)
NEUTROS PCT: 74 % (ref 43–77)
Neutro Abs: 9 10*3/uL — ABNORMAL HIGH (ref 1.7–7.7)
Platelets: 304 10*3/uL (ref 150–400)
RBC: 3.31 MIL/uL — ABNORMAL LOW (ref 3.87–5.11)
RDW: 13.8 % (ref 11.5–15.5)
WBC: 12.1 10*3/uL — ABNORMAL HIGH (ref 4.0–10.5)

## 2013-12-05 LAB — GLUCOSE, CAPILLARY
GLUCOSE-CAPILLARY: 115 mg/dL — AB (ref 70–99)
GLUCOSE-CAPILLARY: 115 mg/dL — AB (ref 70–99)
Glucose-Capillary: 105 mg/dL — ABNORMAL HIGH (ref 70–99)
Glucose-Capillary: 108 mg/dL — ABNORMAL HIGH (ref 70–99)
Glucose-Capillary: 126 mg/dL — ABNORMAL HIGH (ref 70–99)
Glucose-Capillary: 111 mg/dL — ABNORMAL HIGH (ref 70–99)

## 2013-12-05 LAB — BASIC METABOLIC PANEL
Anion gap: 13 (ref 5–15)
BUN: 10 mg/dL (ref 6–23)
CO2: 24 meq/L (ref 19–32)
Calcium: 8.5 mg/dL (ref 8.4–10.5)
Chloride: 99 mEq/L (ref 96–112)
Creatinine, Ser: 0.84 mg/dL (ref 0.50–1.10)
GFR calc Af Amer: 80 mL/min — ABNORMAL LOW (ref >90)
GFR, EST NON AFRICAN AMERICAN: 69 mL/min — AB (ref >90)
Glucose, Bld: 119 mg/dL — ABNORMAL HIGH (ref 70–99)
Potassium: 3.3 mEq/L — ABNORMAL LOW (ref 3.7–5.3)
Sodium: 136 mEq/L — ABNORMAL LOW (ref 137–147)

## 2013-12-05 LAB — HEMOGLOBIN A1C
HEMOGLOBIN A1C: 6.2 % — AB (ref <5.7)
MEAN PLASMA GLUCOSE: 131 mg/dL — AB (ref <117)

## 2013-12-05 LAB — CBC
HEMATOCRIT: 28.2 % — AB (ref 36.0–46.0)
Hemoglobin: 9.5 g/dL — ABNORMAL LOW (ref 12.0–15.0)
MCH: 29.5 pg (ref 26.0–34.0)
MCHC: 33.7 g/dL (ref 30.0–36.0)
MCV: 87.6 fL (ref 78.0–100.0)
PLATELETS: 318 10*3/uL (ref 150–400)
RBC: 3.22 MIL/uL — ABNORMAL LOW (ref 3.87–5.11)
RDW: 13.7 % (ref 11.5–15.5)
WBC: 12.7 10*3/uL — ABNORMAL HIGH (ref 4.0–10.5)

## 2013-12-05 LAB — SEDIMENTATION RATE: Sed Rate: 78 mm/hr — ABNORMAL HIGH (ref 0–22)

## 2013-12-05 LAB — LIPID PANEL
Cholesterol: 158 mg/dL (ref 0–200)
HDL: 46 mg/dL (ref >39)
LDL CALC: 93 mg/dL (ref 0–99)
Total CHOL/HDL Ratio: 3.4 RATIO
Triglycerides: 97 mg/dL (ref <150)
VLDL: 19 mg/dL (ref 0–40)

## 2013-12-05 LAB — C-REACTIVE PROTEIN: CRP: 2.2 mg/dL — ABNORMAL HIGH (ref <0.60)

## 2013-12-05 MED ORDER — POTASSIUM CHLORIDE 10 MEQ/100ML IV SOLN
10.0000 meq | INTRAVENOUS | Status: AC
  Administered 2013-12-05 (×3): 10 meq via INTRAVENOUS
  Filled 2013-12-05 (×4): qty 100

## 2013-12-05 MED ORDER — ALPRAZOLAM 0.5 MG PO TABS: 0.5000 mg | ORAL_TABLET | Freq: Three times a day (TID) | ORAL | Status: DC | PRN

## 2013-12-05 MED ORDER — POTASSIUM CHLORIDE 10 MEQ/100ML IV SOLN
10.0000 meq | INTRAVENOUS | Status: AC
  Administered 2013-12-05 (×2): 10 meq via INTRAVENOUS
  Filled 2013-12-05 (×2): qty 100

## 2013-12-05 NOTE — Progress Notes (Signed)
    Subjective:      Patient is disorientated x 3 Sedated/lethargic.   Not following commands  Objective: Vital signs in last 24 hours: Temp:  [98.3 F (36.8 C)-101.1 F (38.4 C)] 98.4 F (36.9 C) (09/16 0800) Pulse Rate:  [85-110] 85 (09/16 0336) Resp:  [18-28] 27 (09/16 0336) BP: (108-185)/(44-81) 152/57 mmHg (09/16 0336) SpO2:  [96 %-100 %] 99 % (09/16 0336) Weight:  [231 lb (104.781 kg)] 231 lb (104.781 kg) (09/15 1200)  Intake/Output from previous day: 09/15 0701 - 09/16 0700 In: 3075 [I.V.:2575; IV Piggyback:500] Out: 500 [Urine:500]  Labs:  Recent Labs  12/04/13 1217  WBC 12.6*  RBC 3.66*  HCT 32.3*  PLT 407*    Recent Labs  12/04/13 1217  NA 140  K 3.4*  CL 97  CO2 26  BUN 12  CREATININE 0.87  GLUCOSE 155*  CALCIUM 9.4   No results found for this basename: LABPT, INR,  in the last 72 hours  Physical Exam: ABD soft Intact pulses distally Incision: dressing C/D/I and no drainage Compartment soft  Assessment/Plan: Patient with drastic change in mental status compared to last week. Prior to discharge patient was ambulating, A+O X3 and functioning well. Contact Saturday by daughter about mental status changes. I did speak with Dr Volney American (on call MD for SNF) and he spoke with nursing staff. Patient was noted to be A+O x3 but was not satisfied with the facility. According to daughter - Sunday patient deteriorated and I was contacted Monday.  At that time instructed daughter to bring patient to ER for evaluation.  CT scan shows no evidence of surgical site infection.  Wound is clean and dry.  Pat with low grade temp (98.4) WBC count decreased from 13.3 to 12.6.  Will re-order CBC as well as C-reactive protein and ESR. Question neurology evaluation even though head MRI/CT do not show acute process. Will order MRI L/S spine with/without contrast as recommended by radiologist. Continue to monitor patient   Melina Schools, MD [place] [phone]

## 2013-12-05 NOTE — Progress Notes (Signed)
Subjective: [name] feels "fine" this morning. Per her daughter's account, she has gone in and out of being oriented and responsive this morning. Added history: last xanax was taken on 12/02/13.  Of note, during my exam, patient's RUE started to shake and patient's eyes rolled back; these signs lasted for about 30 seconds. Patient did not answer questions during the event. She looked uncomfortable during and after the event, but was able to correctly identify that the month is September directly after the event. Unable to elicit whether she remembers the event.   Per nursing, on some visits, the patient does not speak, only tracking nurse with eyes. On other visits, patient responds clearly. Clearest speech was when she became agitated after hearing antibiotics were being stopped; apparently clearly stated to her daughter that "this is a trap!" Patient has not had visual hallucinations this morning.   Objective: Vital signs in last 24 hours: Filed Vitals:   12/04/13 2205 12/05/13 0053 12/05/13 0336 12/05/13 0800  BP: 123/44 161/60 152/57   Pulse: 87 87 85   Temp: 100.1 F (37.8 C) 99.1 F (37.3 C) 99.6 F (37.6 C) 98.4 F (36.9 C)  TempSrc: Oral Oral Oral Oral  Resp: 27 23 27    Height:      Weight:      SpO2: 97% 98% 99%    Weight change:   Intake/Output Summary (Last 24 hours) at 12/05/13 1057 Last data filed at 12/05/13 0600  Gross per 24 hour  Intake   3075 ml  Output    500 ml  Net   2575 ml   Physical Exam: Appearance: lying in bed with eyes open; choosing not to look at me at beginning of exam, confirms that she recognizes me, daughter at bedside HEENT: diaphoretic, AT/[place], PERRL, no nystagmus, EOMi, pterygium medially OS, moist mucous membranes, no lymphadenopathy  Heart: RRR, normal S1S2, no MRG  Lungs: CTAB, no wheezes Abdomen: BS+, soft, nontender   Musculoskeletal: no joint swelling or tenderness  Extremities: no edema in lower extremities  Neurologic: tongue  has rapid movements up and down (without palatal nystagmus), A&Ox3 (states that the date is September 18th, but knows name, that it is 96, she is at [facility]), CN II-XII intact, FNF intact, strength and sensation intact, did not observe gait  Psychiatric: on initial exam, occasionally bursts out that "you (her daughter) should have gotten me (from the SNF) earlier!". Per report, exhibited paranoid behavior regarding medical care earlier this morning Skin: no rashes, clean and dry wound, noneyrthematous, mildly tender  Lab Results: Basic Metabolic Panel:  Recent Labs Lab 12/04/13 1217 12/05/13 0758  NA 140 136*  K 3.4* 3.3*  CL 97 99  CO2 26 24  GLUCOSE 155* 119*  BUN 12 10  CREATININE 0.87 0.84  CALCIUM 9.4 8.5   Liver Function Tests:  Recent Labs Lab 12/04/13 1217  AST 47*  ALT 51*  ALKPHOS 84  BILITOT 1.0  PROT 7.5  ALBUMIN 3.5   No results found for this basename: LIPASE, AMYLASE,  in the last 168 hours  Recent Labs Lab 12/04/13 1708  AMMONIA 16   CBC:  Recent Labs Lab 12/04/13 1217 12/05/13 0758  WBC 12.6* 12.7*  HGB 10.8* 9.5*  HCT 32.3* 28.2*  MCV 88.3 87.6  PLT 407* 318   Cardiac Enzymes: No results found for this basename: CKTOTAL, CKMB, CKMBINDEX, TROPONINI,  in the last 168 hours BNP: No results found for this basename: PROBNP,  in the last  168 hours D-Dimer: No results found for this basename: DDIMER,  in the last 168 hours CBG:  Recent Labs Lab 12/04/13 1317 12/04/13 1649 12/05/13 0052 12/05/13 0411 12/05/13 0746  GLUCAP 142* 113* 105* 108* 126*   Fasting Lipid Panel:  Recent Labs Lab 12/05/13 0213  CHOL 158  HDL 46  LDLCALC 93  TRIG 97  CHOLHDL 3.4   Thyroid Function Tests:  Recent Labs Lab 12/04/13 1708  TSH 2.400   Urine Drug Screen: Drugs of Abuse     Component Value Date/Time   LABOPIA NONE DETECTED 12/04/2013 1503   COCAINSCRNUR NONE DETECTED 12/04/2013 1503   LABBENZ NONE DETECTED 12/04/2013 1503    AMPHETMU NONE DETECTED 12/04/2013 1503   THCU NONE DETECTED 12/04/2013 1503   LABBARB NONE DETECTED 12/04/2013 1503    Alcohol Level: No results found for this basename: ETH,  in the last 168 hours Patient's daughter confirms that she does not drink alcohol  Urinalysis:  Recent Labs Lab 12/04/13 1503  COLORURINE YELLOW  LABSPEC 1.015  PHURINE 7.5  GLUCOSEU NEGATIVE  HGBUR NEGATIVE  BILIRUBINUR NEGATIVE  KETONESUR 15*  PROTEINUR NEGATIVE  UROBILINOGEN 0.2  NITRITE NEGATIVE  LEUKOCYTESUR NEGATIVE     Micro Results: Recent Results (from the past 240 hour(s))  CULTURE, BLOOD (ROUTINE X 2)     Status: None   Collection Time    12/04/13  2:21 PM      Result Value Ref Range Status   Specimen Description BLOOD ARM RIGHT   Final   Special Requests BOTTLES DRAWN AEROBIC AND ANAEROBIC 5CC   Final   Culture  Setup Time     Final   Value: 12/04/2013 19:00     Performed at [facility]   Culture     Final   Value:        BLOOD CULTURE RECEIVED NO GROWTH TO DATE CULTURE WILL BE HELD FOR 5 DAYS BEFORE ISSUING A FINAL NEGATIVE REPORT     Performed at [facility]   Report Status PENDING   Incomplete  CULTURE, BLOOD (ROUTINE X 2)     Status: None   Collection Time    12/04/13  2:30 PM      Result Value Ref Range Status   Specimen Description BLOOD ARM LEFT   Final   Special Requests BOTTLES DRAWN AEROBIC AND ANAEROBIC 5CC   Final   Culture  Setup Time     Final   Value: 12/04/2013 19:00     Performed at [facility]   Culture     Final   Value:        BLOOD CULTURE RECEIVED NO GROWTH TO DATE CULTURE WILL BE HELD FOR 5 DAYS BEFORE ISSUING A FINAL NEGATIVE REPORT     Performed at [facility]   Report Status PENDING   Incomplete   Studies/Results: Ct Head Wo Contrast  12/04/2013   CLINICAL DATA:  Altered mental status  EXAM: CT HEAD WITHOUT CONTRAST  TECHNIQUE: Contiguous axial images were obtained from the base of the skull through the vertex  without intravenous contrast.  COMPARISON:  12/21/2006  FINDINGS: Mild global atrophy. No mass effect, midline shift, or acute intracranial hemorrhage. Cranium is intact mastoid air cells are clear. Minimal mucus in the sphenoid sinus  IMPRESSION: No acute intracranial pathology.   Electronically Signed   By: Maryclare Bean M.D.   On: 12/04/2013 16:32   Ct Lumbar Spine Wo Contrast  12/05/2013   CLINICAL DATA:  Recent lumbar surgery. Altered mental status and fever, assess for abscess.  EXAM: CT LUMBAR SPINE WITHOUT CONTRAST  TECHNIQUE: Multidetector CT imaging of the lumbar spine was performed without intravenous contrast administration. Multiplanar CT image reconstructions were also generated.  COMPARISON:  Lumbar spine radiographs November 29, 2013 and CT lumbar spine October 05, 2013  FINDINGS: Transitional anatomy , lumbarized S1 vertebral body with small S1-2 disc. Status post L2 through L5 bilateral pedicle screw placed with bridging rods. Solid L5-S1 interbody fusion. Cystic change of the L5-S1 facets may reflect prior hardware failure or sequelae of hardware removal. Pedicle screw tracts at S1. L2 and L3 interbody disc material. Moderate to severe L4-5 disc degeneration, with grade 1 stable L4-5 anterolisthesis. No destructive bony lesions. Vacuum disc T12-L1 and L1-2.  Fluid along the bilateral posterior paraspinal surgical approach, with widely distributed in bone graft material. Free fluid and stranding about the LEFT psoas muscle likely reflecting surgical approach. Trace pneumoperitoneum LEFT abdomen.  No osseous canal stenosis at any level. Suspected moderate to severe at L3-4 and moderate L4-5 neural foraminal narrowing.  IMPRESSION: Limited assessment for abscess on this nonenhanced CT. Contrast enhanced imaging, specifically MRI with contrast would be more sensitive.  Status post lumbar spine hardware revision, with new L2 through L5 pedicle screws, L2-3 and L3-4 interbody disc material, stable L4-5  anterolisthesis. LEFT psoas muscle stranding and effusion likely reflects surgical approach in addition to effusion along the posterior paraspinal surgical approach, with widely distributed posterior bone graft material.   Electronically Signed   By: Elon Alas   On: 12/05/2013 05:08   Mr Brain Wo Contrast  12/04/2013   CLINICAL DATA:  Altered mental status. Lumbar fusion September 2015.  EXAM: MRI HEAD WITHOUT CONTRAST  MRA HEAD WITHOUT CONTRAST  TECHNIQUE: Multiplanar, multiecho pulse sequences of the brain and surrounding structures were obtained without intravenous contrast. Angiographic images of the head were obtained using MRA technique without contrast.  COMPARISON:  MRI of the head May 09, 2012 and CT of the head May 06, 2013 at 1627 hr  FINDINGS: MRI HEAD FINDINGS  No reduced diffusion to suggest acute ischemia. No susceptibility artifact to suggest hemorrhage.  The ventricles and the sulci are normal for patient's age. A few scattered subcentimeter supratentorial white matter T2 hyperintensities, less than expected for age suggest sequelae of chronic small vessel ischemic disease, unchanged. No midline shift or mass effect.  No abnormal extra-axial fluid collections. Major intracranial vascular flow voids observed at the skull base though, better characterized on an MRA of the head as below. Status post bilateral ocular lens implants. No paranasal sinus air-fluid levels ; minimal sphenoid mucosal thickening. Mastoid air cells are well aerated. Fluid replaced non expanded sella. No cerebellar tonsillar ectopia. No suspicious calvarial bone marrow signal. Patient is edentulous.  MRA HEAD FINDINGS  Anterior circulation: Normal flow related enhancement of the included cervical, petrous, cavernous and supra clinoid internal carotid arteries. Patent anterior communicating artery. Normal flow related enhancement of the anterior and middle cerebral arteries, including more distal segments.   Posterior circulation: LEFT vertebral artery is dominant. Basilar artery is patent, with normal flow related enhancement of the main branch vessels. Bilateral posterior communicating arteries present with compensatory diminutive P1 segments. Normal flow related enhancement of the posterior cerebral arteries.  No large vessel occlusion, hemodynamically significant stenosis, abnormal luminal irregularity, aneurysm within the anterior nor posterior circulation.  IMPRESSION: MRI head: No acute intracranial process ; normal noncontrast MRI of the brain for age.  MRA head: No acute vascular process ; no hemodynamically significant stenosis. Complete circle of Willis.   Electronically Signed   By: Elon Alas   On: 12/04/2013 22:00   Dg Chest Portable 1 View  12/04/2013   CLINICAL DATA:  Altered mental status. Hallucinations. Back surgery 1 week ago.  EXAM: PORTABLE CHEST -  COMPARISON:  None.  FINDINGS: Cardiopericardial silhouette within normal limits. Mediastinal contours normal. Trachea midline. No airspace disease or effusion. Low volume chest. Monitoring leads project over the chest.  IMPRESSION: No active disease.   Electronically Signed   By: Dereck Ligas M.D.   On: 12/04/2013 17:00   Mr Jodene Nam Head/brain Wo Cm  12/04/2013   CLINICAL DATA:  Altered mental status. Lumbar fusion September 2015.  EXAM: MRI HEAD WITHOUT CONTRAST  MRA HEAD WITHOUT CONTRAST  TECHNIQUE: Multiplanar, multiecho pulse sequences of the brain and surrounding structures were obtained without intravenous contrast. Angiographic images of the head were obtained using MRA technique without contrast.  COMPARISON:  MRI of the head May 09, 2012 and CT of the head May 06, 2013 at 1627 hr  FINDINGS: MRI HEAD FINDINGS  No reduced diffusion to suggest acute ischemia. No susceptibility artifact to suggest hemorrhage.  The ventricles and the sulci are normal for patient's age. A few scattered subcentimeter supratentorial white matter  T2 hyperintensities, less than expected for age suggest sequelae of chronic small vessel ischemic disease, unchanged. No midline shift or mass effect.  No abnormal extra-axial fluid collections. Major intracranial vascular flow voids observed at the skull base though, better characterized on an MRA of the head as below. Status post bilateral ocular lens implants. No paranasal sinus air-fluid levels ; minimal sphenoid mucosal thickening. Mastoid air cells are well aerated. Fluid replaced non expanded sella. No cerebellar tonsillar ectopia. No suspicious calvarial bone marrow signal. Patient is edentulous.  MRA HEAD FINDINGS  Anterior circulation: Normal flow related enhancement of the included cervical, petrous, cavernous and supra clinoid internal carotid arteries. Patent anterior communicating artery. Normal flow related enhancement of the anterior and middle cerebral arteries, including more distal segments.  Posterior circulation: LEFT vertebral artery is dominant. Basilar artery is patent, with normal flow related enhancement of the main branch vessels. Bilateral posterior communicating arteries present with compensatory diminutive P1 segments. Normal flow related enhancement of the posterior cerebral arteries.  No large vessel occlusion, hemodynamically significant stenosis, abnormal luminal irregularity, aneurysm within the anterior nor posterior circulation.  IMPRESSION: MRI head: No acute intracranial process ; normal noncontrast MRI of the brain for age.  MRA head: No acute vascular process ; no hemodynamically significant stenosis. Complete circle of Willis.   Electronically Signed   By: Elon Alas   On: 12/04/2013 22:00   Medications: I have reviewed the patient's current medications. Scheduled Meds: . aspirin EC  81 mg Oral Daily  . fentaNYL  50 mcg Intravenous Once  . heparin  5,000 Units Subcutaneous 3 times per day   Continuous Infusions:  PRN Meds:.acetaminophen, acetaminophen,  metoprolol Assessment/Plan: Active Problems:   Altered mental status  [name] is a 68 yo woman who was recently hospitalized for a lumbar spinal fusion surgery, then has developed progressively altered mental status. Her daughters report that she had visual hallucinations while still in the hospital last week. She was then transferred to rehab and began speaking frequently about what sounds like a traumatic stay at her SNF. Since being on our service, she has gone in and out of alertness and in and  out of orientation. She has also displayed some paranoid behavior.  She has had a fever to 101.1 with leukocytosis to 12.6, but her urinalysis and chest xray have found no source of infection. Orthopaedics evaluated and patient received a CT lumbar spine. There were no signs of infection at the wound site or on CT, but MRI with/without contrast is pending for better visualization of the area. Her head CT and brain MRA/MRI were negative.   Altered Mental Status: Continue to look for a cause of the AMS with urine and blood cultures (to investigate the leukocytosis 13.3-->12.6 with negative UA, CXR) and EEG. Brain MRI/MRA negative. Her lactic acid, ammonia and TSH and LFTs are WNL.  - Appreciate Neurology consult to evaluate for seizure and rule out encephalopathy - Neuro checks q4 hours; notify MD of MS changes  - EEG routine pending - Zosyn and vancomycin d/c today - Blood cultures x2 pending  - ESR, CRP pending - Urine culture pending - HgbA1c pending - Continue aspirin - Continue NS IVF 75 cc/hr in setting of NPO - Metoprolol PRN   - Strict I's & O's  - Continue tele   Hallucinations:  - Reorient frequently  - Position patient so she can see window   Recent Lumbar Spinal Stenosis: Lumbar CT no sign of abscess, but need to visualize by MRI as per radiology - MRI L/S spine with and without contrast - Wound care  - Appreciate Orthopaedics consult  Hypokalemia: 3.4-->3.3-->given potassium  chloride 10 meq in 100 mL IVF x2  - Repeat BMP pending  Code Status:  - Confirm code status (was formerly full)   Diet:  - NPO until mental status improves  DVT Ppx:  - Baker heparin  Dispo: Disposition is deferred at this time, awaiting improvement of current medical problems.  Anticipated discharge in approximately 2-3 day(s).   The patient does have a current PCP Ernestene Kiel, MD) and does not need an [facility] hospital follow-up appointment after discharge.  The patient does have transportation limitations that hinder transportation to clinic appointments.  .Services Needed at time of discharge: Y = Yes, Blank = No PT:   OT:   RN:   Equipment:   Other:     LOS: 1 day   Drucilla Schmidt, MD 12/05/2013, 10:57 AM

## 2013-12-05 NOTE — Progress Notes (Signed)
EEG Completed; Results Pending  

## 2013-12-05 NOTE — Progress Notes (Signed)
Utilization review completed.  

## 2013-12-05 NOTE — Progress Notes (Signed)
[facility]  Patient Status: Active (receiving services up to time of hospitalization)  [facility] is providing the following services: PT and OT Referred for [facility] services but was admitted to hospital prior to start of services.  If patient discharges after hours, please call [phone].   Kizzie Furnish 12/05/2013, 3:29 PM

## 2013-12-05 NOTE — Procedures (Signed)
ELECTROENCEPHALOGRAM REPORT  Patient: [name]       Room #: 0J81 EEG No. ID: [id number] Age: 68 y.o.        Sex: female Referring Physician: Rogelia Boga, E Report Date:  12/05/2013        Interpreting Physician: Aline Brochure  History: [name] is an 68 y.o. female admitted with altered mental status with confusion and hallucinations. She status post lumbar fusion one week ago.  Indications for study:  Assess severity of encephalopathy; rule out focal seizure activity.  Technique: This is an 18 channel routine scalp EEG performed at the bedside with bipolar and monopolar montages arranged in accordance to the international 10/20 system of electrode placement.   Description: This EEG recording was performed during wakefulness. Predominant background activity consisted of 10-11 Hz alpha rhythm report from the posterior head regions which attenuates well with eye opening. Photic stimulation and hyperventilation were not performed. No epileptiform discharges were recorded. There was no abnormal slowing of cerebral activity.  Interpretation: This is a normal EEG recording.   Venetia Maxon M.D. [facility] [phone]

## 2013-12-05 NOTE — Consult Note (Signed)
NEURO HOSPITALIST CONSULT NOTE    Reason for Consult:AMS  HPI:                                                                                                                                         Much of information gathered by chart.    [name] is an 68 y.o. female "here with altered mental status and new onset hallucinations several days after a recent hospitalization for a spinal fusion for treatment of her lumbar stenosis. She was admitted for surgery on 11/28/13 and experienced some vague hallucinations after the surgery while still in the hospital on 11/29/13 and 11/30/13 that were attributed to the anesthesia. Her pain was treated with percocet.  She was then transferred to a SNF on 9/11. That night, she called her daughter, reporting worsened hallucinations and the feeling that people at the SNF were "out to get her". Her daughter proceeded to take her out of the SNF a day later; she therefore spent last night at home. She slept for an abnormally long time last night and was then mumbling and difficult to follow upon awakening this morning. Per her daughters, she has become more and more disoriented and her mumbling. On admission her WBC was 12.6, temperature was 101.1. CT head was normal and CXR showed no infection. During hospitalization patient had witnessed episode of "RUE started to shake and patient's eyes rolled back; these signs lasted for about 30 seconds. Patient did not answer questions during the event. She looked uncomfortable during and after the event, but was able to correctly identify that the month is September directly after the event." in another incident "nursing noted, on some visits, the patient does not speak, only tracking nurse with eyes. On other visits, patient responds clearly. Clearest speech was when she became agitated after hearing antibiotics were being stopped". neurology was asked to see patient to rule out seizure verus infectious  encephalitis verus psychogenic etiology for her waxing and waning changes in mental status.  Sed rate 78 Ammonia 16, TSH WNL Urine culture pending Preliminary blood culture shows no growth Normal MRI and MRA of brain EEG pending current temp is 99.5  Currently patient is awake, able to tell me she is in [facility], the year is 2015 and follow simple commands.  While asking her questions her responses at times will be fluent and other times stuttering with nonsensical speech. Family at bedside states she is very anxious and often is like this at home.  She takes 0.5 mg Xanax at home for her anxiety.   Past Medical History  Diagnosis Date  . Hypertension   . Shortness of breath   . Depression   . Hypothyroidism   . Anxiety   . GERD (gastroesophageal reflux disease)   .  History of blood transfusion 2004    "related to back OR"  . Arthritis     "multiple places; hands, feet, etc." (11/29/2013)  . Chronic lower back pain     Past Surgical History  Procedure Laterality Date  . Back surgery    . Carpal tunnel release Bilateral   . Posterior lumbar fusion  11/28/2013    "hardware removal; ~ 6 screws and 2 rods"  . Reduction mammaplasty    . Excisional hemorrhoidectomy    . Joint replacement    . Total knee arthroplasty Bilateral   . Tubal ligation    . Abdominal hysterectomy      partial  . Posterior lumbar fusion  2004    "placed screws and rods"  . Cataract extraction w/ intraocular lens  implant, bilateral Bilateral       Family History: Mother HTN Father HTN  Social History:  reports that she has never smoked. She has never used smokeless tobacco. She reports that she does not drink alcohol or use illicit drugs.  No Known Allergies  MEDICATIONS:                                                                                                                     Scheduled: . aspirin EC  81 mg Oral Daily  . fentaNYL  50 mcg Intravenous Once  . heparin  5,000  Units Subcutaneous 3 times per day  . potassium chloride  10 mEq Intravenous Q1 Hr x 4     ROS:                                                                                                                                       History obtained from the patient and family  General ROS: negative for - chills, fatigue, fever, night sweats, weight gain or weight loss Psychological ROS: negative for - behavioral disorder, hallucinations, memory difficulties, mood swings or suicidal ideation Ophthalmic ROS: negative for - blurry vision, double vision, eye pain or loss of vision ENT ROS: negative for - epistaxis, nasal discharge, oral lesions, sore throat, tinnitus or vertigo Allergy and Immunology ROS: negative for - hives or itchy/watery eyes Hematological and Lymphatic ROS: negative for - bleeding problems, bruising or swollen lymph nodes Endocrine ROS: negative for - galactorrhea, hair pattern changes, polydipsia/polyuria or temperature intolerance Respiratory ROS: negative for - cough, hemoptysis, shortness of breath or wheezing Cardiovascular  ROS: negative for - chest pain, dyspnea on exertion, edema or irregular heartbeat Gastrointestinal ROS: negative for - abdominal pain, diarrhea, hematemesis, nausea/vomiting or stool incontinence Genito-Urinary ROS: negative for - dysuria, hematuria, incontinence or urinary frequency/urgency Musculoskeletal ROS: negative for - joint swelling or muscular weakness Neurological ROS: as noted in HPI Dermatological ROS: negative for rash and skin lesion changes   Blood pressure 152/57, pulse 85, temperature 97.9 F (36.6 C), temperature source Oral, resp. rate 27, height  (1.575 m), weight 104.781 kg (231 lb), SpO2 99.00%.   Neurologic Examination:                                                                                                      General: anxious Mental Status: Alert, oriented to place, year, month and people at bedside. Speech  fluent without evidence of aphasia but at time nonsensical--this will clear if firmly asked questions.   Able to follow simple step commands. Cranial Nerves: II: Discs flat bilaterally; Visual fields grossly normal, pupils equal, round, reactive to light and accommodation III,IV, VI: ptosis not present, extra-ocular motions intact bilaterally V,VII: smile symmetric, facial light touch sensation normal bilaterally VIII: hearing normal bilaterally IX,X: gag reflex present XI: bilateral shoulder shrug XII: midline tongue extension without atrophy or fasciculations  Motor: Moving all extremities antigravity with 4/5 strength. At rest she is shaking her legs-this will stop when asked her to perform activities with legs.  Sensory: Pinprick and light touch intact throughout, bilaterally Deep Tendon Reflexes:  Right: Upper Extremity   Left: Upper extremity   biceps (C-5 to C-6) 2/4   biceps (C-5 to C-6) 2/4 tricep (C7) 2/4    triceps (C7) 2/4 Brachioradialis (C6) 2/4  Brachioradialis (C6) 2/4  Lower Extremity Lower Extremity  quadriceps (L-2 to L-4) 2/4   quadriceps (L-2 to L-4) 2/4 Achilles (S1) 1/4   Achilles (S1) 1/4  Plantars: Right: downgoing   Left: downgoing Cerebellar: No dysmetria with finger to nose or heel to shin Gait: not tested CV: pulses palpable throughout    Lab Results: Basic Metabolic Panel:  Recent Labs Lab 11/28/13 1452 12/04/13 1217 12/05/13 0758  NA 138 140 136*  K 3.8 3.4* 3.3*  CL  --  97 99  CO2  --  26 24  GLUCOSE 145* 155* 119*  BUN  --  12 10  CREATININE  --  0.87 0.84  CALCIUM  --  9.4 8.5    Liver Function Tests:  Recent Labs Lab 12/04/13 1217  AST 47*  ALT 51*  ALKPHOS 84  BILITOT 1.0  PROT 7.5  ALBUMIN 3.5   No results found for this basename: LIPASE, AMYLASE,  in the last 168 hours  Recent Labs Lab 12/04/13 1708  AMMONIA 16    CBC:  Recent Labs Lab 11/28/13 1452 11/29/13 0625 12/04/13 1217 12/05/13 0758  12/05/13 1120  WBC  --  13.3* 12.6* 12.7* 12.1*  NEUTROABS  --   --   --   --  9.0*  HGB 11.6* 10.1* 10.8* 9.5* 9.6*  HCT 34.0* 30.5* 32.3* 28.2*  29.0*  MCV  --  89.2 88.3 87.6 87.6  PLT  --  262 407* 318 304    Cardiac Enzymes: No results found for this basename: CKTOTAL, CKMB, CKMBINDEX, TROPONINI,  in the last 168 hours  Lipid Panel:  Recent Labs Lab 12/05/13 0213  CHOL 158  TRIG 97  HDL 46  CHOLHDL 3.4  VLDL 19  LDLCALC 93    CBG:  Recent Labs Lab 12/04/13 1649 12/05/13 0052 12/05/13 0411 12/05/13 0746 12/05/13 1225  GLUCAP 113* 105* 108* 126* 111*    Microbiology: Results for orders placed during the hospital encounter of 12/04/13  CULTURE, BLOOD (ROUTINE X 2)     Status: None   Collection Time    12/04/13  2:21 PM      Result Value Ref Range Status   Specimen Description BLOOD ARM RIGHT   Final   Special Requests BOTTLES DRAWN AEROBIC AND ANAEROBIC 5CC   Final   Culture  Setup Time     Final   Value: 12/04/2013 19:00     Performed at [facility]   Culture     Final   Value:        BLOOD CULTURE RECEIVED NO GROWTH TO DATE CULTURE WILL BE HELD FOR 5 DAYS BEFORE ISSUING A FINAL NEGATIVE REPORT     Performed at [facility]   Report Status PENDING   Incomplete  CULTURE, BLOOD (ROUTINE X 2)     Status: None   Collection Time    12/04/13  2:30 PM      Result Value Ref Range Status   Specimen Description BLOOD ARM LEFT   Final   Special Requests BOTTLES DRAWN AEROBIC AND ANAEROBIC 5CC   Final   Culture  Setup Time     Final   Value: 12/04/2013 19:00     Performed at [facility]   Culture     Final   Value:        BLOOD CULTURE RECEIVED NO GROWTH TO DATE CULTURE WILL BE HELD FOR 5 DAYS BEFORE ISSUING A FINAL NEGATIVE REPORT     Performed at [facility]   Report Status PENDING   Incomplete    Coagulation Studies: No results found for this basename: LABPROT, INR,  in the last 72 hours  Imaging: Ct Head Wo  Contrast  12/04/2013   CLINICAL DATA:  Altered mental status  EXAM: CT HEAD WITHOUT CONTRAST  TECHNIQUE: Contiguous axial images were obtained from the base of the skull through the vertex without intravenous contrast.  COMPARISON:  12/21/2006  FINDINGS: Mild global atrophy. No mass effect, midline shift, or acute intracranial hemorrhage. Cranium is intact mastoid air cells are clear. Minimal mucus in the sphenoid sinus  IMPRESSION: No acute intracranial pathology.   Electronically Signed   By: Maryclare Bean M.D.   On: 12/04/2013 16:32   Ct Lumbar Spine Wo Contrast  12/05/2013   CLINICAL DATA:  Recent lumbar surgery. Altered mental status and fever, assess for abscess.  EXAM: CT LUMBAR SPINE WITHOUT CONTRAST  TECHNIQUE: Multidetector CT imaging of the lumbar spine was performed without intravenous contrast administration. Multiplanar CT image reconstructions were also generated.  COMPARISON:  Lumbar spine radiographs November 29, 2013 and CT lumbar spine October 05, 2013  FINDINGS: Transitional anatomy , lumbarized S1 vertebral body with small S1-2 disc. Status post L2 through L5 bilateral pedicle screw placed with bridging rods. Solid L5-S1 interbody fusion. Cystic change of the L5-S1 facets may reflect  prior hardware failure or sequelae of hardware removal. Pedicle screw tracts at S1. L2 and L3 interbody disc material. Moderate to severe L4-5 disc degeneration, with grade 1 stable L4-5 anterolisthesis. No destructive bony lesions. Vacuum disc T12-L1 and L1-2.  Fluid along the bilateral posterior paraspinal surgical approach, with widely distributed in bone graft material. Free fluid and stranding about the LEFT psoas muscle likely reflecting surgical approach. Trace pneumoperitoneum LEFT abdomen.  No osseous canal stenosis at any level. Suspected moderate to severe at L3-4 and moderate L4-5 neural foraminal narrowing.  IMPRESSION: Limited assessment for abscess on this nonenhanced CT. Contrast enhanced imaging,  specifically MRI with contrast would be more sensitive.  Status post lumbar spine hardware revision, with new L2 through L5 pedicle screws, L2-3 and L3-4 interbody disc material, stable L4-5 anterolisthesis. LEFT psoas muscle stranding and effusion likely reflects surgical approach in addition to effusion along the posterior paraspinal surgical approach, with widely distributed posterior bone graft material.   Electronically Signed   By: Awilda Metro   On: 12/05/2013 05:08   Mr Brain Wo Contrast  12/04/2013   CLINICAL DATA:  Altered mental status. Lumbar fusion September 2015.  EXAM: MRI HEAD WITHOUT CONTRAST  MRA HEAD WITHOUT CONTRAST  TECHNIQUE: Multiplanar, multiecho pulse sequences of the brain and surrounding structures were obtained without intravenous contrast. Angiographic images of the head were obtained using MRA technique without contrast.  COMPARISON:  MRI of the head May 09, 2012 and CT of the head May 06, 2013 at 1627 hr  FINDINGS: MRI HEAD FINDINGS  No reduced diffusion to suggest acute ischemia. No susceptibility artifact to suggest hemorrhage.  The ventricles and the sulci are normal for patient's age. A few scattered subcentimeter supratentorial white matter T2 hyperintensities, less than expected for age suggest sequelae of chronic small vessel ischemic disease, unchanged. No midline shift or mass effect.  No abnormal extra-axial fluid collections. Major intracranial vascular flow voids observed at the skull base though, better characterized on an MRA of the head as below. Status post bilateral ocular lens implants. No paranasal sinus air-fluid levels ; minimal sphenoid mucosal thickening. Mastoid air cells are well aerated. Fluid replaced non expanded sella. No cerebellar tonsillar ectopia. No suspicious calvarial bone marrow signal. Patient is edentulous.  MRA HEAD FINDINGS  Anterior circulation: Normal flow related enhancement of the included cervical, petrous, cavernous and  supra clinoid internal carotid arteries. Patent anterior communicating artery. Normal flow related enhancement of the anterior and middle cerebral arteries, including more distal segments.  Posterior circulation: LEFT vertebral artery is dominant. Basilar artery is patent, with normal flow related enhancement of the main branch vessels. Bilateral posterior communicating arteries present with compensatory diminutive P1 segments. Normal flow related enhancement of the posterior cerebral arteries.  No large vessel occlusion, hemodynamically significant stenosis, abnormal luminal irregularity, aneurysm within the anterior nor posterior circulation.  IMPRESSION: MRI head: No acute intracranial process ; normal noncontrast MRI of the brain for age.  MRA head: No acute vascular process ; no hemodynamically significant stenosis. Complete circle of Willis.   Electronically Signed   By: Awilda Metro   On: 12/04/2013 22:00   Dg Chest Portable 1 View  12/04/2013   CLINICAL DATA:  Altered mental status. Hallucinations. Back surgery 1 week ago.  EXAM: PORTABLE CHEST -  COMPARISON:  None.  FINDINGS: Cardiopericardial silhouette within normal limits. Mediastinal contours normal. Trachea midline. No airspace disease or effusion. Low volume chest. Monitoring leads project over the chest.  IMPRESSION: No active disease.  Electronically Signed   By: Andreas Newport M.D.   On: 12/04/2013 17:00   Mr Maxine Glenn Head/brain Wo Cm  12/04/2013   CLINICAL DATA:  Altered mental status. Lumbar fusion September 2015.  EXAM: MRI HEAD WITHOUT CONTRAST  MRA HEAD WITHOUT CONTRAST  TECHNIQUE: Multiplanar, multiecho pulse sequences of the brain and surrounding structures were obtained without intravenous contrast. Angiographic images of the head were obtained using MRA technique without contrast.  COMPARISON:  MRI of the head May 09, 2012 and CT of the head May 06, 2013 at 1627 hr  FINDINGS: MRI HEAD FINDINGS  No reduced diffusion to  suggest acute ischemia. No susceptibility artifact to suggest hemorrhage.  The ventricles and the sulci are normal for patient's age. A few scattered subcentimeter supratentorial white matter T2 hyperintensities, less than expected for age suggest sequelae of chronic small vessel ischemic disease, unchanged. No midline shift or mass effect.  No abnormal extra-axial fluid collections. Major intracranial vascular flow voids observed at the skull base though, better characterized on an MRA of the head as below. Status post bilateral ocular lens implants. No paranasal sinus air-fluid levels ; minimal sphenoid mucosal thickening. Mastoid air cells are well aerated. Fluid replaced non expanded sella. No cerebellar tonsillar ectopia. No suspicious calvarial bone marrow signal. Patient is edentulous.  MRA HEAD FINDINGS  Anterior circulation: Normal flow related enhancement of the included cervical, petrous, cavernous and supra clinoid internal carotid arteries. Patent anterior communicating artery. Normal flow related enhancement of the anterior and middle cerebral arteries, including more distal segments.  Posterior circulation: LEFT vertebral artery is dominant. Basilar artery is patent, with normal flow related enhancement of the main branch vessels. Bilateral posterior communicating arteries present with compensatory diminutive P1 segments. Normal flow related enhancement of the posterior cerebral arteries.  No large vessel occlusion, hemodynamically significant stenosis, abnormal luminal irregularity, aneurysm within the anterior nor posterior circulation.  IMPRESSION: MRI head: No acute intracranial process ; normal noncontrast MRI of the brain for age.  MRA head: No acute vascular process ; no hemodynamically significant stenosis. Complete circle of Willis.   Electronically Signed   By: Awilda Metro   On: 12/04/2013 22:00       Assessment and plan per attending neurologist  Felicie Morn PA-C [facility] [phone]  12/05/2013, 2:31 PM   Assessment/Plan: 68 YO female presenting to hospital with AMS in setting of elevated temperature and WBC. Etiology for persistent altered mental status is clear. While hospitalized fever has subsided but now showing elevation again. MRI brain is negative for acute stroke or abnormality. Urine cultures pending. EEG was normal. Patient has also been on Xanax 0.5 mg TID at home which has not been restarted while in hospital.     Recommend: 1) Restart Xanax at home dose 2) EEG 3) continue to evaluate for infectious versus metabolic process  We will continue to follow this patient with you.   I personally participate in this patient's evaluation and management, including formulating above clinical impression and management recommendations.  Venetia Maxon M.D. [facility] [phone]

## 2013-12-05 NOTE — Progress Notes (Signed)
PT Cancellation Note  Patient Details Name: [name] MRN: [id number] DOB: 11/03/1945   Cancelled Treatment:    Reason Eval/Treat Not Completed: Patient at procedure or test/unavailable   Fabio Asa 12/05/2013, 2:39 PM Charlotte Crumb, PT DPT  [phone]

## 2013-12-05 NOTE — Progress Notes (Signed)
  Date: 12/05/2013  Patient name: [name]  Medical record number: [id number]  Date of birth: 19-Apr-1945   I have seen and evaluated [name] and discussed their care with the Residency Team. [name] was admitted for acute encephalopathy after a lumbar fusion for spinal stenosis on 9/915. She had post-op visual hallucinations (this was never documented in EMR) and was transferred to SNF on 9/11/5 about which she was unhappy. On the 13th, the daughter states the pt had a good PT session and was up and walking. However, the family states that the pt cont to have hallucinations at the SNF and brought the pt home on the 14th. She then slept uncharacteristically long the night of the 14th, and on the 15th had mumbling, difficult to awake, and disoriented.   W/U to date :  CXR nl CT head nl MRI head nl CR lumbar : limited since no contrast but no gross abscess WBC 12.7 HgB 9.6, 3 gram drop since 11/22/2013 CO2 26 Gap 17 now 13 Trop nl Lactic acid nl EKG no ischemic changes UA nl except 15 ketones  Exam T max 101.1 Her mental status flucuated widely during the 10 min I was in room. At times, she would freely talk, answer questions (garbled speech), other times would yell out "Leave the room" in clear voice, she overheard the daughter and I talk about infection and then the pt stated she had an infection, sometimes would do a stuttering type of speech for 20 sec or so and then stop, other times she was unresponsive. Withdrew R foot to pain, did not withdrawal R hand to pain. Not cooperative to other neuro exam HRRR L CTAB anteriorly  Assessment and Plan: I have seen and evaluated the patient as outlined above. I agree with the formulated Assessment and Plan as detailed in the residents' admission note, with the following changes:   1. Acute encephalopathy - Many causes have been R/O per W/U to date. We willl ask neuro to see pt as sz is still on differential as well as an infectious  encephalitis (but MRI shows no suggestion so will hold off on LP). A pysch cause like conversion d/o is possible but will first r/o neuro etiologies.  2. Febrile illness - will stop Vanc and Zosyn as we have found no source. Ortho is getting MRI lumbar spine currently. If pt remains afebrile, then ATX post surgical might explain the fever and leukocytosis. If she remains febrile, will need to investigate further.      Burns Spain, MD 9/16/20151:38 PM

## 2013-12-06 ENCOUNTER — Encounter (HOSPITAL_COMMUNITY): Payer: Self-pay | Admitting: Orthopedic Surgery

## 2013-12-06 ENCOUNTER — Inpatient Hospital Stay (HOSPITAL_COMMUNITY): Payer: Medicare HMO

## 2013-12-06 LAB — BASIC METABOLIC PANEL
ANION GAP: 18 — AB (ref 5–15)
BUN: 13 mg/dL (ref 6–23)
CO2: 20 mEq/L (ref 19–32)
Calcium: 8.8 mg/dL (ref 8.4–10.5)
Chloride: 101 mEq/L (ref 96–112)
Creatinine, Ser: 0.8 mg/dL (ref 0.50–1.10)
GFR calc Af Amer: 85 mL/min — ABNORMAL LOW (ref >90)
GFR calc non Af Amer: 73 mL/min — ABNORMAL LOW (ref >90)
Glucose, Bld: 108 mg/dL — ABNORMAL HIGH (ref 70–99)
POTASSIUM: 3.7 meq/L (ref 3.7–5.3)
Sodium: 139 mEq/L (ref 137–147)

## 2013-12-06 LAB — CBC
HCT: 29.9 % — ABNORMAL LOW (ref 36.0–46.0)
HEMOGLOBIN: 10 g/dL — AB (ref 12.0–15.0)
MCH: 29.4 pg (ref 26.0–34.0)
MCHC: 33.4 g/dL (ref 30.0–36.0)
MCV: 87.9 fL (ref 78.0–100.0)
Platelets: 348 10*3/uL (ref 150–400)
RBC: 3.4 MIL/uL — AB (ref 3.87–5.11)
RDW: 14.1 % (ref 11.5–15.5)
WBC: 12.6 10*3/uL — ABNORMAL HIGH (ref 4.0–10.5)

## 2013-12-06 LAB — GLUCOSE, CAPILLARY
GLUCOSE-CAPILLARY: 107 mg/dL — AB (ref 70–99)
GLUCOSE-CAPILLARY: 120 mg/dL — AB (ref 70–99)
GLUCOSE-CAPILLARY: 103 mg/dL — AB (ref 70–99)
GLUCOSE-CAPILLARY: 160 mg/dL — AB (ref 70–99)
Glucose-Capillary: 118 mg/dL — ABNORMAL HIGH (ref 70–99)
Glucose-Capillary: 116 mg/dL — ABNORMAL HIGH (ref 70–99)

## 2013-12-06 LAB — URINE CULTURE
Colony Count: NO GROWTH
Culture: NO GROWTH

## 2013-12-06 MED ORDER — LEVOTHYROXINE SODIUM 75 MCG PO TABS
75.0000 ug | ORAL_TABLET | Freq: Every day | ORAL | Status: DC
  Administered 2013-12-07: 75 ug via ORAL
  Filled 2013-12-06 (×2): qty 1

## 2013-12-06 MED ORDER — GADOBENATE DIMEGLUMINE 529 MG/ML IV SOLN
20.0000 mL | Freq: Once | INTRAVENOUS | Status: AC | PRN
  Administered 2013-12-06: 20 mL via INTRAVENOUS

## 2013-12-06 MED ORDER — ALPRAZOLAM 0.5 MG PO TABS
0.5000 mg | ORAL_TABLET | Freq: Two times a day (BID) | ORAL | Status: DC | PRN
  Administered 2013-12-06 – 2013-12-07 (×3): 0.5 mg via ORAL
  Filled 2013-12-06 (×3): qty 1

## 2013-12-06 MED ORDER — ALPRAZOLAM 0.5 MG PO TABS
0.5000 mg | ORAL_TABLET | Freq: Every day | ORAL | Status: DC
  Administered 2013-12-07: 0.5 mg via ORAL
  Filled 2013-12-06: qty 1

## 2013-12-06 NOTE — Progress Notes (Signed)
Subjective: Patient had no new complaints. She was agitated overnight and required mittens, she tried to remove her IV line.  Objective: Current vital signs: BP 142/71  Pulse 93  Temp(Src) 98.9 F (37.2 C) (Oral)  Resp 22  Ht  (1.575 m)  Wt 104.781 kg (231 lb)  BMI 42.24 kg/m2  SpO2 97%  Neurologic Exam: Alert and in no acute distress. Patient is well-oriented to time as well as place. Patient have a tremor of lower extremities and to a lesser extent upper extremities and jaw, which abated when distracted. Patient moved extremities equally with no signs of focal weakness.  Medications: I have reviewed the patient's current medications.  Assessment/Plan: Altered mental status improving. Etiology is unclear but suspect medications, will possibly withdrawal from benzodiazepine medication. EEG on 12/05/2013 was normal with no signs of encephalopathic process nor seizure activity. Intermittent tremor appears to be largely functional.  Recommend no changes in current management plans. No further neurological intervention is indicated at this point. I will plan to see her on an as-needed basis after this visit.  C.R. Roseanne Reno, MD [facility] [phone]  12/06/2013  9:03 AM

## 2013-12-06 NOTE — Progress Notes (Signed)
  Date: 12/06/2013  Patient name: [name]  Medical record number: [id number]  Date of birth: 02-Dec-1945   This patient has been seen and the plan of care was discussed with the house staff. Please see their note for complete details. I concur with their findings with the following additions/corrections: Today the pt is quite alert, sitting up in bed, ready to eat, speech is much more clear. She is alert and oriented. Basic understanding of why she is here - has been confused. She was in MRI to get imagine of lumbar spine to R/O abscess but mental status / confusion / inability to lie still and two episodes of incontinence (urinary) made [place] cancel the MRI.  1. Acute encephalopathy - Many etiologies R/O but no clear etiology found. Pt now seems to be at post-surgical baseline. Neuro added home dose scheduled benzo.   2. Febrile illness - remains afebrile off Vanc and Zosyn. No fever. Leukocytosis stable. We have found no source. MRI lumbar spine had to be cancelled. If temp spikes or leukocytosis increases, would attempt to get MRI.   3. Dispo - diet, PT/OT, likely will need to go to SNF. Might be medically stable 9/18.    Burns Spain, MD 12/06/2013, 11:25 AM

## 2013-12-06 NOTE — Progress Notes (Signed)
Report received from [place], Charity fundraiser

## 2013-12-06 NOTE — Evaluation (Signed)
Physical Therapy Evaluation Patient Details Name: [name] MRN: [id number] DOB: 16-Aug-1945 Today's Date: 12/06/2013   History of Present Illness  [name] is an 68 y.o. female admitted with altered mental status with confusion and hallucinations. She status post lumbar fusion on 11/28/13.  Clinical Impression  Patient presents with decreased functional mobility and activity tolerance. Evaluation limited secondary to AMS and no brace available at this time. Discussed patient needs and assist available with family extensively. At this time, family wishes to take patient home post discharge.  Will continue to work with patient and progress as tolerated when brace available OF NOTE: Family reports that this is nowhere near patients baseline for mental status and cognition. Will continue to monitor, reached out to MD to make aware of family concerns.    Follow Up Recommendations Home health PT;Supervision/Assistance - 24 hour (would benefit from SNF but family desires to take pt home)    Equipment Recommendations   (TBD)    Recommendations for Other Services       Precautions / Restrictions Precautions Precautions: Back Precaution Booklet Issued: Yes (comment) Required Braces or Orthoses: Spinal Brace (spinal brace not in room) Spinal Brace: Lumbar corset;Applied in sitting position Restrictions Weight Bearing Restrictions: No      Mobility  Bed Mobility               General bed mobility comments: did not assess   Transfers                 General transfer comment: did not assess due to pt cognition and no brace. RN following up with MD regarding brace  Ambulation/Gait                Stairs            Wheelchair Mobility    Modified Rankin (Stroke Patients Only)       Balance                                             Pertinent Vitals/Pain Pain Assessment: No/denies pain    Home Living Family/patient expects  to be discharged to:: Private residence Living Arrangements: Children;Other relatives Available Help at Discharge: Family;Available 24 hours/day Type of Home: House Home Access: Stairs to enter   [employer] of Steps: 1 Home Layout: Two level;Able to live on main level with bedroom/bathroom Home Equipment: Dan Humphreys - 2 wheels;Walker - 4 wheels;Bedside commode;Shower seat      Prior Function Level of Independence: Independent with assistive device(s) (pt used cane vs RW.  )         Comments: prior to spinal surgery.     Hand Dominance        Extremity/Trunk Assessment   Upper Extremity Assessment: Generalized weakness           Lower Extremity Assessment: Generalized weakness (symetrical but weak 4-/5 bilaterally)         Communication      Cognition Arousal/Alertness: Awake/alert Behavior During Therapy:  (extremely confused) Overall Cognitive Status: Impaired/Different from baseline Area of Impairment: Orientation;Attention;Memory;Following commands;Safety/judgement;Awareness;Problem solving Orientation Level:  (oriented x3 but all other aspects of congition questionable) Current Attention Level: Focused Memory: Decreased recall of precautions;Decreased short-term memory Following Commands: Follows one step commands with increased time Safety/Judgement: Decreased awareness of safety;Decreased awareness of deficits Awareness: Intellectual Problem Solving: Slow processing;Decreased initiation;Difficulty  sequencing;Requires verbal cues;Requires tactile cues General Comments: Patient oriented x3 however, as patient continues on through conversation it is apparent that she is not making sense, and her family (while acknowledging that this is a slight improvement) remain extremely concerned as this is not anywhere close to patients baseline.     General Comments      Exercises        Assessment/Plan    PT Assessment Patient needs continued PT  services  PT Diagnosis Difficulty walking;Generalized weakness   PT Problem List Decreased strength;Decreased activity tolerance;Decreased balance;Decreased mobility;Decreased knowledge of use of DME;Decreased knowledge of precautions;Obesity  PT Treatment Interventions DME instruction;Gait training;Stair training;Functional mobility training;Therapeutic activities;Therapeutic exercise;Balance training;Patient/family education   PT Goals (Current goals can be found in the Care Plan section) Acute Rehab PT Goals Patient Stated Goal: family states goal is to take patient home PT Goal Formulation: With patient Time For Goal Achievement: 12/20/13 Potential to Achieve Goals: Good    Frequency Min 3X/week   Barriers to discharge Decreased caregiver support      Co-evaluation               End of Session   Activity Tolerance: Other (comment) (patient limited by cognition, no back brace) Patient left: in bed;with call bell/phone within reach;with family/visitor present Nurse Communication: Mobility status;Precautions;Other (comment) (Need for back brace)         Time: 8119-1478 PT Time Calculation (min): 24 min   Charges:   PT Evaluation $Initial PT Evaluation Tier I: 1 Procedure PT Treatments $Self Care/Home Management: 8-22   PT G CodesFabio Asa 12/06/2013, 2:50 PM Charlotte Crumb, PT DPT  [phone]

## 2013-12-06 NOTE — Progress Notes (Signed)
PT Cancellation Note  Patient Details Name: [name] MRN: [id number] DOB: 1945/11/20   Cancelled Treatment:    Reason Eval/Treat Not Completed: Patient at procedure or test/unavailable, patient at MRI   Fabio Asa 12/06/2013, 10:09 AM Charlotte Crumb, PT DPT  [phone]

## 2013-12-06 NOTE — Progress Notes (Signed)
Pt very confused during OT eval and very different from post surgery last week as this OT saw pt last week.  OT did call MD to relay this as pt had trouble with word finding, focusing on therapist, etc.  Last week pt was cognitively intact and did great with therapy.  Pt presented very different this day [name], [place] [phone]

## 2013-12-06 NOTE — Progress Notes (Signed)
Patient ID: [name], female   DOB: February 23, 1946, 68 y.o.   MRN: [id number]    Subjective:    pain controlled.  Still confused.  Multiple family members present.  Per Dr Donnelly Stager note patient unable to have MRI lumbar spine as ordered by dr brooks because she wouldn't lie still and had 2 episodes of urinary incontinence.  We weren't notified.  Family stated they weren't aware that the scan was not done.  Per RN patient just had an episode of fecal incontinence.     Objective: Vital signs in last 24 hours: Temp:  [98.4 F (36.9 C)-100.1 F (37.8 C)] 98.8 F (37.1 C) (09/17 1200) Pulse Rate:  [93-107] 93 (09/17 0800) Resp:  [20-37] 27 (09/17 1200) BP: (110-175)/(62-95) 110/62 mmHg (09/17 1200) SpO2:  [96 %-98 %] 97 % (09/17 0800)  Intake/Output from previous day: 09/16 0701 - 09/17 0700 In: -  Out: 650 [Urine:650] Intake/Output this shift:    Labs:  Recent Labs  12/04/13 1217 12/05/13 0758 12/05/13 1120 12/06/13 0336  HGB 10.8* 9.5* 9.6* 10.0*    Recent Labs  12/05/13 1120 12/06/13 0336  WBC 12.1* 12.6*  RBC 3.31* 3.40*  HCT 29.0* 29.9*  PLT 304 348    Recent Labs  12/05/13 0758 12/06/13 0336  NA 136* 139  K 3.3* 3.7  CL 99 101  CO2 24 20  BUN 10 13  CREATININE 0.84 0.80  GLUCOSE 119* 108*  CALCIUM 8.5 8.8   No results found for this basename: LABPT, INR,  in the last 72 hours  Physical Exam: Patient still confused.  Difficult for her to follow commands when asked to move her but did.  bilat calves nontender.    Assessment/Plan: S/p multilevel lumbar fusion.  Postop altered mental status with unclear etiology.  Question urinary and fecal incontinence that started today.  Discussed urinary/bowel issue with Dr Shon Baton.  He wants STAT mri lumbar spine with sedation since patient was not able to lie still.  Family members adamantly refused the order for sedation.  I explained to them that the scan needed to be done to r/o cauda equina syndrome and any  other possible issues.  Told them that this need to be done ASAP.  They stated that they wanted to sit in scanner with patient.  I spoke with radiology and RN and advised them to have transport come up now to get patient.  Advised radiology that we will cancel the sedation.  I also discussed this with Dr Jean Rosenthal (anesthesia).    Naida Sleight for Dr. Venita Lick [facility] [phone] 12/06/2013, 1:15 PM

## 2013-12-06 NOTE — Progress Notes (Signed)
OT Cancellation Note  Patient Details Name: [name] MRN: [id number] DOB: December 12, 1945   Cancelled Treatment:    Reason Eval/Treat Not Completed: Patient at procedure or test/ unavailable Lise Auer, OT [phone]  Einar Crow D 12/06/2013, 10:55 AM

## 2013-12-06 NOTE — Progress Notes (Signed)
Subjective: [name] feels "good!" this morning. States "I am still a little confused". Daughter says she has been disoriented at times last night and today, thinking "she didn't make it", but is able to speak at her baseline. Thinks home Xanax is a good idea. Had been taking it about 1x/day.  Had one episode of trying to pull out IV; mittens were put in place. The mittens are causing some confusion / distress.  Tmax 100.1; currently 99.8.   Objective: Vital signs in last 24 hours: Filed Vitals:   12/05/13 1600 12/05/13 1935 12/05/13 2320 12/06/13 0414  BP:  159/85 158/95 110/86  Pulse:  102 107 94  Temp: 99.5 F (37.5 C) 98.4 F (36.9 C) 100.1 F (37.8 C) 99.8 F (37.7 C)  TempSrc: Oral Oral Oral Oral  Resp:  37 22 31  Height:      Weight:      SpO2:  97% 97% 96%   Weight change:   Intake/Output Summary (Last 24 hours) at 12/06/13 8182 Last data filed at 12/05/13 2242  Gross per 24 hour  Intake      0 ml  Output    650 ml  Net   -650 ml   Physical Exam: Appearance: lying in bed with eyes open, mittens in place HEENT: diaphoretic, AT/[place], PERRL, no nystagmus, EOMi, pterygium medially OS, moist mucous membranes, no lymphadenopathy  Heart: RRR, normal S1S2, no MRG  Lungs: CTAB, no wheezes Abdomen: BS+, soft, nontender   Musculoskeletal: no joint swelling or tenderness  Extremities: no edema in lower extremities  Neurologic: tongue has rapid movements up and down (without palatal nystagmus), A&Ox3 (states that the date is September 13th, but knows name, that it is 30, she is at [employer], Obama is president), CN II-XII intact, FNF intact, strength and sensation intact, did not observe gait  Psychiatric: on initial exam, occasionally bursts out that "you (her daughter) should have gotten me (from the SNF) earlier!". Per report, exhibited paranoid behavior regarding medical care yesterday; none since Skin: no rashes, clean and dry wound, noneyrthematous, mildly  tender  Lab Results: Basic Metabolic Panel:  Recent Labs Lab 12/05/13 0758 12/06/13 0336  NA 136* 139  K 3.3* 3.7  CL 99 101  CO2 24 20  GLUCOSE 119* 108*  BUN 10 13  CREATININE 0.84 0.80  CALCIUM 8.5 8.8   Liver Function Tests:  Recent Labs Lab 12/04/13 1217  AST 47*  ALT 51*  ALKPHOS 84  BILITOT 1.0  PROT 7.5  ALBUMIN 3.5   Recent Labs Lab 12/04/13 1708  AMMONIA 16   CBC:  Recent Labs Lab 12/05/13 0758 12/05/13 1120 12/06/13 0336  WBC 12.7* 12.1* 12.6*  NEUTROABS  --  9.0*  --   HGB 9.5* 9.6* 10.0*  HCT 28.2* 29.0* 29.9*  MCV 87.6 87.6 87.9  PLT 318 304 348   CBG:  Recent Labs Lab 12/05/13 0746 12/05/13 1225 12/05/13 1655 12/05/13 2058 12/05/13 2318 12/06/13 0412  GLUCAP 126* 111* 115* 115* 107* 120*   Fasting Lipid Panel:  Recent Labs Lab 12/05/13 0213  CHOL 158  HDL 46  LDLCALC 93  TRIG 97  CHOLHDL 3.4   Thyroid Function Tests:  Recent Labs Lab 12/04/13 1708  TSH 2.400   Urine Drug Screen: Drugs of Abuse     Component Value Date/Time   LABOPIA NONE DETECTED 12/04/2013 1503   COCAINSCRNUR NONE DETECTED 12/04/2013 1503   LABBENZ NONE DETECTED 12/04/2013 1503   AMPHETMU NONE DETECTED 12/04/2013  [place] 12/04/2013 1503   LABBARB NONE DETECTED 12/04/2013 1503    Alcohol Level: No results found for this basename: ETH,  in the last 168 hours Patient's daughter confirms that she does not drink alcohol  Urinalysis:  Recent Labs Lab 12/04/13 1503  COLORURINE YELLOW  LABSPEC 1.015  PHURINE 7.5  GLUCOSEU NEGATIVE  HGBUR NEGATIVE  BILIRUBINUR NEGATIVE  KETONESUR 15*  PROTEINUR NEGATIVE  UROBILINOGEN 0.2  NITRITE NEGATIVE  LEUKOCYTESUR NEGATIVE   Micro Results: Blood culture x2: NGTD  Studies/Results: Ct Head Wo Contrast  12/04/2013   CLINICAL DATA:  Altered mental status  EXAM: CT HEAD WITHOUT CONTRAST  TECHNIQUE: Contiguous axial images were obtained from the base of the skull through the vertex  without intravenous contrast.  COMPARISON:  12/21/2006  FINDINGS: Mild global atrophy. No mass effect, midline shift, or acute intracranial hemorrhage. Cranium is intact mastoid air cells are clear. Minimal mucus in the sphenoid sinus  IMPRESSION: No acute intracranial pathology.   Electronically Signed   By: Maryclare Bean M.D.   On: 12/04/2013 16:32   Ct Lumbar Spine Wo Contrast  12/05/2013   CLINICAL DATA:  Recent lumbar surgery. Altered mental status and fever, assess for abscess.  EXAM: CT LUMBAR SPINE WITHOUT CONTRAST  TECHNIQUE: Multidetector CT imaging of the lumbar spine was performed without intravenous contrast administration. Multiplanar CT image reconstructions were also generated.  COMPARISON:  Lumbar spine radiographs November 29, 2013 and CT lumbar spine October 05, 2013  FINDINGS: Transitional anatomy , lumbarized S1 vertebral body with small S1-2 disc. Status post L2 through L5 bilateral pedicle screw placed with bridging rods. Solid L5-S1 interbody fusion. Cystic change of the L5-S1 facets may reflect prior hardware failure or sequelae of hardware removal. Pedicle screw tracts at S1. L2 and L3 interbody disc material. Moderate to severe L4-5 disc degeneration, with grade 1 stable L4-5 anterolisthesis. No destructive bony lesions. Vacuum disc T12-L1 and L1-2.  Fluid along the bilateral posterior paraspinal surgical approach, with widely distributed in bone graft material. Free fluid and stranding about the LEFT psoas muscle likely reflecting surgical approach. Trace pneumoperitoneum LEFT abdomen.  No osseous canal stenosis at any level. Suspected moderate to severe at L3-4 and moderate L4-5 neural foraminal narrowing.  IMPRESSION: Limited assessment for abscess on this nonenhanced CT. Contrast enhanced imaging, specifically MRI with contrast would be more sensitive.  Status post lumbar spine hardware revision, with new L2 through L5 pedicle screws, L2-3 and L3-4 interbody disc material, stable L4-5  anterolisthesis. LEFT psoas muscle stranding and effusion likely reflects surgical approach in addition to effusion along the posterior paraspinal surgical approach, with widely distributed posterior bone graft material.   Electronically Signed   By: Elon Alas   On: 12/05/2013 05:08   Mr Brain Wo Contrast  12/04/2013   CLINICAL DATA:  Altered mental status. Lumbar fusion September 2015.  EXAM: MRI HEAD WITHOUT CONTRAST  MRA HEAD WITHOUT CONTRAST  TECHNIQUE: Multiplanar, multiecho pulse sequences of the brain and surrounding structures were obtained without intravenous contrast. Angiographic images of the head were obtained using MRA technique without contrast.  COMPARISON:  MRI of the head May 09, 2012 and CT of the head May 06, 2013 at 1627 hr  FINDINGS: MRI HEAD FINDINGS  No reduced diffusion to suggest acute ischemia. No susceptibility artifact to suggest hemorrhage.  The ventricles and the sulci are normal for patient's age. A few scattered subcentimeter supratentorial white matter T2 hyperintensities, less than expected for age suggest  sequelae of chronic small vessel ischemic disease, unchanged. No midline shift or mass effect.  No abnormal extra-axial fluid collections. Major intracranial vascular flow voids observed at the skull base though, better characterized on an MRA of the head as below. Status post bilateral ocular lens implants. No paranasal sinus air-fluid levels ; minimal sphenoid mucosal thickening. Mastoid air cells are well aerated. Fluid replaced non expanded sella. No cerebellar tonsillar ectopia. No suspicious calvarial bone marrow signal. Patient is edentulous.  MRA HEAD FINDINGS  Anterior circulation: Normal flow related enhancement of the included cervical, petrous, cavernous and supra clinoid internal carotid arteries. Patent anterior communicating artery. Normal flow related enhancement of the anterior and middle cerebral arteries, including more distal segments.   Posterior circulation: LEFT vertebral artery is dominant. Basilar artery is patent, with normal flow related enhancement of the main branch vessels. Bilateral posterior communicating arteries present with compensatory diminutive P1 segments. Normal flow related enhancement of the posterior cerebral arteries.  No large vessel occlusion, hemodynamically significant stenosis, abnormal luminal irregularity, aneurysm within the anterior nor posterior circulation.  IMPRESSION: MRI head: No acute intracranial process ; normal noncontrast MRI of the brain for age.  MRA head: No acute vascular process ; no hemodynamically significant stenosis. Complete circle of Willis.   Electronically Signed   By: Elon Alas   On: 12/04/2013 22:00   Dg Chest Portable 1 View  12/04/2013   CLINICAL DATA:  Altered mental status. Hallucinations. Back surgery 1 week ago.  EXAM: PORTABLE CHEST -  COMPARISON:  None.  FINDINGS: Cardiopericardial silhouette within normal limits. Mediastinal contours normal. Trachea midline. No airspace disease or effusion. Low volume chest. Monitoring leads project over the chest.  IMPRESSION: No active disease.   Electronically Signed   By: Dereck Ligas M.D.   On: 12/04/2013 17:00   Mr Jodene Nam Head/brain Wo Cm  12/04/2013   CLINICAL DATA:  Altered mental status. Lumbar fusion September 2015.  EXAM: MRI HEAD WITHOUT CONTRAST  MRA HEAD WITHOUT CONTRAST  TECHNIQUE: Multiplanar, multiecho pulse sequences of the brain and surrounding structures were obtained without intravenous contrast. Angiographic images of the head were obtained using MRA technique without contrast.  COMPARISON:  MRI of the head May 09, 2012 and CT of the head May 06, 2013 at 1627 hr  FINDINGS: MRI HEAD FINDINGS  No reduced diffusion to suggest acute ischemia. No susceptibility artifact to suggest hemorrhage.  The ventricles and the sulci are normal for patient's age. A few scattered subcentimeter supratentorial white matter  T2 hyperintensities, less than expected for age suggest sequelae of chronic small vessel ischemic disease, unchanged. No midline shift or mass effect.  No abnormal extra-axial fluid collections. Major intracranial vascular flow voids observed at the skull base though, better characterized on an MRA of the head as below. Status post bilateral ocular lens implants. No paranasal sinus air-fluid levels ; minimal sphenoid mucosal thickening. Mastoid air cells are well aerated. Fluid replaced non expanded sella. No cerebellar tonsillar ectopia. No suspicious calvarial bone marrow signal. Patient is edentulous.  MRA HEAD FINDINGS  Anterior circulation: Normal flow related enhancement of the included cervical, petrous, cavernous and supra clinoid internal carotid arteries. Patent anterior communicating artery. Normal flow related enhancement of the anterior and middle cerebral arteries, including more distal segments.  Posterior circulation: LEFT vertebral artery is dominant. Basilar artery is patent, with normal flow related enhancement of the main branch vessels. Bilateral posterior communicating arteries present with compensatory diminutive P1 segments. Normal flow related enhancement of the posterior  cerebral arteries.  No large vessel occlusion, hemodynamically significant stenosis, abnormal luminal irregularity, aneurysm within the anterior nor posterior circulation.  IMPRESSION: MRI head: No acute intracranial process ; normal noncontrast MRI of the brain for age.  MRA head: No acute vascular process ; no hemodynamically significant stenosis. Complete circle of Willis.   Electronically Signed   By: Elon Alas   On: 12/04/2013 22:00   Medications: I have reviewed the patient's current medications. Scheduled Meds: . aspirin EC  81 mg Oral Daily  . fentaNYL  50 mcg Intravenous Once  . heparin  5,000 Units Subcutaneous 3 times per day   Continuous Infusions:  PRN Meds:.acetaminophen, acetaminophen,  ALPRAZolam, metoprolol Assessment/Plan: Active Problems:   Altered mental status  [name] is a 68 yo woman who was recently hospitalized for a lumbar spinal fusion surgery, then has developed progressively altered mental status. Her daughters report that she had visual hallucinations while still in the hospital last week. She was then transferred to rehab; both by report and on exam, she fixates on what sounds like a somewhat traumatic time at the SNF. Since being on our service, she has gone in and out of alertness and in and out of orientation. She has also displayed some paranoid behavior.  She has had a fever to 101.1 with leukocytosis to 12.7; both are now down-trending. Her workup has found no source of infection to date, with a negative urinalysis, chest xray and blood cultures to date. Orthopaedics evaluation and CT lumbar spine also did not find signs of infection; MRI with/without pending to further investigate the surgical site. Her head CT, brain MRA/MRI and EEG were negative.   Altered Mental Status: Continue to look for a cause of the AMS with final urine and blood cultures. WBC and fever curve down-trending slightly off of abx (vanc and zosyn d/c). Her lactic acid, ammonia and ESR, TSH and LFTs are WNL. HgbA1c 6.2%. - Appreciate Neurology consult to evaluate for seizure and rule out encephalopathy - Neuro checks q4 hours; notify MD of MS changes  - Blood cultures x2 pending  - Urine culture pending - Continue aspirin - Metoprolol PRN   - Strict I's & O's  - Continue tele  - Restart 0.5 mg xanax as suggested by neurology; once per day with BID PRN (home dose was TID PRN) - Regular diet with assistance / observation today  Hallucinations:  - Reorient frequently  - Position patient so she can see window  - Remove mittens; daughter will be with her all day  Recent Lumbar Spinal Stenosis: Lumbar CT no sign of abscess, but need to visualize by MRI as per radiology - MRI L/S  spine with and without contrast this morning - Wound care  - Appreciate Orthopaedics consult  Hypokalemia: Corrected with potassium chloride 10 meq; 3.4-->3.3-->3.7   Code Status:  - Confirm code status (was formerly full)   Diet:  - NPO; speech eval on schedule for today  DVT Ppx:  - [place] heparin  Dispo: Disposition is deferred at this time, awaiting improvement of current medical problems.  Anticipated discharge in approximately 0-1 day(s).   The patient does have a current PCP Ernestene Kiel, MD) and does not need an [facility] hospital follow-up appointment after discharge.  The patient does have transportation limitations that hinder transportation to clinic appointments.  .Services Needed at time of discharge: Y = Yes, Blank = No PT:   OT:   RN:   Equipment:   Other:  LOS: 2 days   Drucilla Schmidt, MD 12/06/2013, 7:17 AM

## 2013-12-06 NOTE — Evaluation (Signed)
Occupational Therapy Evaluation Patient Details Name: [name] MRN: [id number] DOB: November 28, 1945 Today's Date: 12/06/2013    History of Present Illness [name] is an 68 y.o. female admitted with altered mental status with confusion and hallucinations. She status post lumbar fusion on 11/28/13.   Clinical Impression   Pt presents to OT with decreased I with ADL activity s/p  Admission to [facility] with AMS. Pt presents very differently  To this OT compared to last week post surgery.  Pt did call MD regarding pts confusion and presentation.  OT and PT not able to get pt OOB this day as back brace not present and pt having a hard time following directions during session Pt will benefit from continued OT  To increase I and return to PLOF  Follow Up Recommendations  SNF;Home health OT;Supervision/Assistance - 24 hour (family wants to take patient home but will need significant A)    Equipment Recommendations    back brace per ortho      Precautions / Restrictions Precautions Precautions: Back Precaution Booklet Issued: Yes (comment) Required Braces or Orthoses: Spinal Brace (spinal brace not in room) Spinal Brace: Lumbar corset;Applied in sitting position Restrictions Weight Bearing Restrictions: No      Mobility Bed Mobility               General bed mobility comments: did not assess   Transfers                 General transfer comment: did not assess due to pt cognition and no brace. RN following up with MD regarding brace    Balance                                            ADL       Grooming: Bed level;Minimal assistance                                 General ADL Comments: pt had trouble staying on task and focusing on task at hand.  Pt also had word finding difficulties. This therapist saw this pt post back surgery and pt very differrent from post surgery               Pertinent Vitals/Pain Pain Assessment:  No/denies pain     Hand Dominance     Extremity/Trunk Assessment Upper Extremity Assessment Upper Extremity Assessment: Generalized weakness   Lower Extremity Assessment Lower Extremity Assessment: Generalized weakness (symetrical but weak 4-/5 bilaterally)       Communication     Cognition Arousal/Alertness: Awake/alert Behavior During Therapy:  (extremely confused) Overall Cognitive Status: Impaired/Different from baseline Area of Impairment: Orientation;Attention;Memory;Following commands;Safety/judgement;Awareness;Problem solving Orientation Level:  (oriented x3 but all other aspects of congition questionable) Current Attention Level: Focused Memory: Decreased recall of precautions;Decreased short-term memory Following Commands: Follows one step commands with increased time Safety/Judgement: Decreased awareness of safety;Decreased awareness of deficits Awareness: Intellectual Problem Solving: Slow processing;Decreased initiation;Difficulty sequencing;Requires verbal cues;Requires tactile cues General Comments: Patient oriented x3 however, as patient continues on through conversation it is apparent that she is not making sense, and her family (while acknowledging that this is a slight improvement) remain extremely concerned as this is not anywhere close to patients baseline.    General Comments    Pt having trouble focusing and  finding words during OT session.  Reported this RN and MD           Home Living Family/patient expects to be discharged to:: Private residence Living Arrangements: Children;Other relatives Available Help at Discharge: Family;Available 24 hours/day Type of Home: House Home Access: Stairs to enter [employer] of Steps: 1   Home Layout: Two level;Able to live on main level with bedroom/bathroom               Home Equipment: Dan Humphreys - 2 wheels;Walker - 4 wheels;Bedside commode;Shower seat          Prior Functioning/Environment  Level of Independence: Independent with assistive device(s) (pt used cane vs RW.  )        Comments: prior to spinal surgery.    OT Diagnosis: Generalized weakness;Altered mental status   OT Problem List: Decreased strength;Decreased activity tolerance;Decreased cognition   OT Treatment/Interventions: Self-care/ADL training;Patient/family education;DME and/or AE instruction    OT Goals(Current goals can be found in the care plan section) Acute Rehab OT Goals Patient Stated Goal: family states goal is to take patient home  OT Frequency: Min 2X/week              End of Session Nurse Communication: Mobility status;Other (comment) (decreased cognition)  Activity Tolerance: Other (comment) (limited by brace not present and cognition) Patient left: in bed;with family/visitor present;with call bell/phone within reach   Time: 1157-1221 OT Time Calculation (min): 24 min Charges:  OT General Charges $OT Visit: 1 Procedure OT Evaluation $Initial OT Evaluation Tier I: 1 Procedure OT Treatments $Self Care/Home Management : 8-22 mins G-Codes:    Einar Crow D 2013/12/31, 1:54 PM

## 2013-12-06 NOTE — Progress Notes (Signed)
Patient improved Still has difficult with speech - word searching and expression Moving all extremities to command No significant back pain Incisions C/D/I Compartments soft/nt No evidence of infection or cauda equina on MRI Mobilization Speech pathology/OT to eval. Will monitor exam No acute surgical complications

## 2013-12-07 LAB — GLUCOSE, CAPILLARY
Glucose-Capillary: 127 mg/dL — ABNORMAL HIGH (ref 70–99)
Glucose-Capillary: 117 mg/dL — ABNORMAL HIGH (ref 70–99)

## 2013-12-07 LAB — CBC
HEMATOCRIT: 30.7 % — AB (ref 36.0–46.0)
HEMOGLOBIN: 10.1 g/dL — AB (ref 12.0–15.0)
MCH: 29.9 pg (ref 26.0–34.0)
MCHC: 32.9 g/dL (ref 30.0–36.0)
MCV: 90.8 fL (ref 78.0–100.0)
Platelets: 341 10*3/uL (ref 150–400)
RBC: 3.38 MIL/uL — ABNORMAL LOW (ref 3.87–5.11)
RDW: 14.5 % (ref 11.5–15.5)
WBC: 10.9 10*3/uL — ABNORMAL HIGH (ref 4.0–10.5)

## 2013-12-07 MED ORDER — ACETAMINOPHEN 650 MG RE SUPP: 650.0000 mg | Freq: Four times a day (QID) | RECTAL | Status: DC | PRN

## 2013-12-07 MED ORDER — ACETAMINOPHEN 500 MG PO TABS
500.0000 mg | ORAL_TABLET | Freq: Four times a day (QID) | ORAL | Status: DC | PRN
  Administered 2013-12-07: 500 mg via ORAL

## 2013-12-07 NOTE — Progress Notes (Signed)
Pt became agitated and restless.  Attempted to get up out of bed.  Daughter tried to console and redirect pt.  Staff called to room and pt was becoming more upset.  Administered xanax and redirected pt to bed.  Daughter voiced "I can't take you home like this mom."  Paged Dr. Leatha Gilding who will have discussion with daughter.   Will continue to monitor pt.

## 2013-12-07 NOTE — Progress Notes (Signed)
Physical Therapy Treatment Patient Details Name: [name] MRN: [id number] DOB: 05-Nov-1945 Today's Date: 12/07/2013    History of Present Illness [name] is an 68 y.o. female admitted with altered mental status with confusion and hallucinations. She status post lumbar fusion on 11/28/13.    PT Comments    Pt continues to have cognitive deficits limiting mobility and safety.  Daughter present during session and states plan is for pt to return to home, however question if daughter will be able to manage pt at home with present cognitive deficits.  If pt D/C to home will need HHPT/OT/RN  And 24hr care.  Will continue to follow.    Follow Up Recommendations  SNF     Equipment Recommendations  None recommended by PT    Recommendations for Other Services       Precautions / Restrictions Precautions Precautions: Back Precaution Booklet Issued: Yes (comment) Precaution Comments: Reviewed back precautions.   Required Braces or Orthoses: Spinal Brace (Brace not delivered yet.  ) Spinal Brace: Lumbar corset;Applied in sitting position Restrictions Weight Bearing Restrictions: No    Mobility  Bed Mobility Overal bed mobility: Needs Assistance Bed Mobility: Rolling;Sidelying to Sit;Sit to Sidelying Rolling: Min assist Sidelying to sit: Min assist   Sit to supine: Mod assist Sit to sidelying: Min assist General bed mobility comments: cues for log roll and technique.    Transfers                 General transfer comment: Did not perform as pt did not have brace.    Ambulation/Gait                 Stairs            Wheelchair Mobility    Modified Rankin (Stroke Patients Only)       Balance                                    Cognition Arousal/Alertness: Awake/alert Behavior During Therapy: WFL for tasks assessed/performed Overall Cognitive Status: Impaired/Different from baseline Area of Impairment:  Orientation;Attention;Memory;Following commands;Safety/judgement;Awareness;Problem solving Orientation Level: Disoriented to;Time;Situation (appropriately used calendar for day of week, but not month) Current Attention Level: Sustained Memory: Decreased short-term memory;Decreased recall of precautions Following Commands: Follows one step commands with increased time;Follows multi-step commands inconsistently Safety/Judgement: Decreased awareness of safety;Decreased awareness of deficits Awareness: Intellectual Problem Solving: Slow processing;Difficulty sequencing;Requires verbal cues;Requires tactile cues General Comments: pt appropriately indicates use of calendar on wall to A with orientation, however while reading calendar pt was unable to tell PT current month, would simply state "18".  pt will begin a sentence sounding on topic, but then either doesn''t finish sentence or starts to say things off topic.      Exercises      General Comments        Pertinent Vitals/Pain Pain Assessment: No/denies pain Pain Score: 2  Pain Location:  (back) Pain Descriptors / Indicators: Aching Pain Intervention(s): Patient requesting pain meds-RN notified    Home Living                      Prior Function            PT Goals (current goals can now be found in the care plan section) Acute Rehab PT Goals Patient Stated Goal: family states goal is to take patient home PT Goal Formulation:  With patient Time For Goal Achievement: 12/20/13 Potential to Achieve Goals: Good Progress towards PT goals: Progressing toward goals    Frequency  Min 3X/week    PT Plan Discharge plan needs to be updated    Co-evaluation             End of Session   Activity Tolerance:  (Limited by cognition) Patient left: in bed;with call bell/phone within reach;with family/visitor present;with bed alarm set     Time: 1610-9604 PT Time Calculation (min): 23 min  Charges:  $Therapeutic  Activity: 23-37 mins                    G CodesSunny Schlein, [place] [phone] 12/07/2013, 2:58 PM

## 2013-12-07 NOTE — Discharge Instructions (Signed)
Please continue to orient Ms. Cegielski at home. Develop routines, and position the patient near a window. Encourage participation in occupational therapy and physical therapy.   [facility] will be calling to schedule an appointment once they review the chart.   Please also make an appointment with Dr. Sudie Bailey (PCP) in the coming 1 week (office was closed today). Dr. Sudie Bailey may consider outpatient psychiatry referral in the future if hallucinations persist.  Return to the hospital if your mom develops fever, suddenly increased confusion or pain.   Altered Mental Status Altered mental status most often refers to an abnormal change in your responsiveness and awareness. It can affect your speech, thought, mobility, memory, attention span, or alertness. It can range from slight confusion to complete unresponsiveness (coma). Altered mental status can be a sign of a serious underlying medical condition. Rapid evaluation and medical treatment is necessary for patients having an altered mental status. CAUSES   Low blood sugar (hypoglycemia) or diabetes.  Severe loss of body fluids (dehydration) or a body salt (electrolyte) imbalance.  A stroke or other neurologic problem, such as dementia or delirium.  A head injury or tumor.  A drug or alcohol overdose.  Exposure to toxins or poisons.  Depression, anxiety, and stress.  A low oxygen level (hypoxia).  An infection.  Blood loss.  Twitching or shaking (seizure).  Heart problems, such as heart attack or heart rhythm problems (arrhythmias).  A body temperature that is too low or too high (hypothermia or hyperthermia). DIAGNOSIS  A diagnosis is based on your history, symptoms, physical and neurologic examinations, and diagnostic tests. Diagnostic tests may include:  Measurement of your blood pressure, pulse, breathing, and oxygen levels (vital signs).  Blood tests.  Urine tests.  X-ray exams.  A computerized magnetic scan  (magnetic resonance imaging, MRI).  A computerized X-ray scan (computed tomography, CT scan). TREATMENT  Treatment will depend on the cause. Treatment may include:  Management of an underlying medical or mental health condition.  Critical care or support in the hospital. HOME CARE INSTRUCTIONS   Only take over-the-counter or prescription medicines for pain, discomfort, or fever as directed by your caregiver.  Manage underlying conditions as directed by your caregiver.  Eat a healthy, well-balanced diet to maintain strength.  Join a support group or prevention program to cope with the condition or trauma that caused the altered mental status. Ask your caregiver to help choose a program that works for you.  Follow up with your caregiver for further examination, therapy, or testing as directed. SEEK MEDICAL CARE IF:   You feel unwell or have chills.  You or your family notice a change in your behavior or your alertness.  You have trouble following your caregiver's treatment plan.  You have questions or concerns. SEEK IMMEDIATE MEDICAL CARE IF:   You have a rapid heartbeat or have chest pain.  You have difficulty breathing.  You have a fever.  You have a headache with a stiff neck.  You cough up blood.  You have blood in your urine or stool.  You have severe agitation or confusion. MAKE SURE YOU:   Understand these instructions.  Will watch your condition.  Will get help right away if you are not doing well or get worse. Document Released: 08/26/2009 Document Revised: 05/31/2011 Document Reviewed: 08/26/2009 [facility] Patient Information 2015 [place], [place]. This information is not intended to replace advice given to you by your health care provider. Make sure you discuss any questions you have with  your health care provider.

## 2013-12-07 NOTE — Care Management Note (Signed)
    Page 1 of 1   12/07/2013     2:49:09 PM CARE MANAGEMENT NOTE 12/07/2013  Patient:  [name], [name]   Account Number:  [id number]  Date Initiated:  12/05/2013  Documentation initiated by:  Donn Pierini  Subjective/Objective Assessment:   Pt admitted with AMS/ new onset hallucinations     Action/Plan:   PTA pt was recently discharged to SNF-[facility]- however family took pt home from SNF prior to pt being readmitted.   Anticipated DC Date:  12/07/2013   Anticipated DC Plan:  HOME W HOME HEALTH SERVICES      DC Planning Services  CM consult      [facility] Choice  HOME HEALTH   Choice offered to / List presented to:  C-4 Adult Children        HH arranged  HH-1 RN  HH-2 PT  HH-3 OT      [facility] agency  [facility]   Status of service:  Completed, signed off Medicare Important Message given?  YES (If response is "NO", the following Medicare IM given date fields will be blank) Date Medicare IM given:  12/07/2013 Medicare IM given by:  Letha Cape Date Additional Medicare IM given:   Additional Medicare IM given by:    Discharge Disposition:  HOME W HOME HEALTH SERVICES  Per UR Regulation:  Reviewed for med. necessity/level of care/duration of stay  If discussed at Long Length of Stay Meetings, dates discussed:    Comments:  12/07/13 1447 Letha Cape RN, BSN [phone] patient for dc to home today with daughter Juliette Alcide cell [phone], home [phone].  Daughter chose [facility] for [facility], PT, OT, referral made to [facility], Lupita Leash notified.  Soc will begin 24-48 hrs post dc.

## 2013-12-07 NOTE — Progress Notes (Signed)
Family given discharge instructions.  PIV removed and dressing changed to lower back.  Pt taken to discharge location via wheelchair.

## 2013-12-07 NOTE — Discharge Summary (Signed)
Name: [name] MRN: [id number] DOB: [birth year] 68 y.o. PCP: Ernestene Kiel, MD  Date of Admission: 12/04/2013 12:59 PM Date of Discharge: 12/07/2013 Attending Physician: Bartholomew Crews, MD  Discharge Diagnosis: Active Problems:   Altered mental status  Discharge Medications:   Medication List         ALPRAZolam 0.5 MG tablet  Commonly known as:  XANAX  Take 0.5 mg by mouth 3 (three) times daily as needed for anxiety.     CALCIUM 500 +D 500-400 MG-UNIT Tabs  Generic drug:  Calcium Carb-Cholecalciferol  Take 1 tablet by mouth daily.     docusate sodium 100 MG capsule  Commonly known as:  COLACE  Take 1 capsule (100 mg total) by mouth 2 (two) times daily.     famotidine 20 MG tablet  Commonly known as:  PEPCID  Take 20 mg by mouth daily as needed for heartburn or indigestion.     levothyroxine 75 MCG tablet  Commonly known as:  SYNTHROID, LEVOTHROID  Take 75 mcg by mouth daily before breakfast.     magnesium oxide 400 MG tablet  Commonly known as:  MAG-OX  Take 400 mg by mouth daily.     methocarbamol 500 MG tablet  Commonly known as:  ROBAXIN  Take 1 tablet (500 mg total) by mouth every 6 (six) hours as needed for muscle spasms.     olmesartan-hydrochlorothiazide 40-25 MG per tablet  Commonly known as:  BENICAR HCT  Take 1 tablet by mouth daily.     omeprazole 20 MG capsule  Commonly known as:  PRILOSEC  Take 20 mg by mouth daily.     ondansetron 4 MG disintegrating tablet  Commonly known as:  ZOFRAN ODT  Take 1 tablet (4 mg total) by mouth every 8 (eight) hours as needed.     oxyCODONE-acetaminophen 10-325 MG per tablet  Commonly known as:  PERCOCET  Take 1 tablet by mouth every 6 (six) hours as needed for pain.     polyethylene glycol packet  Commonly known as:  MIRALAX / GLYCOLAX  Take 17 g by mouth daily.        Disposition and follow-up:   [name] was discharged from [facility] in Stable condition.   At the hospital follow up visit please address:  1.  [name] was admitted with AMS that started the day after lumbar spinal stenosis surgery. Compared to prior to her surgery, as per her daughters, her speech is shakier, she is seeing people in the room who aren't there, and she is not making sense. She has had several episodes of paranoid thinking while at SNF and then while rehospitalized as well.    Workup: urinalysis, urine culture, blood culture, chest XR, CT head, MRI/MRA brain, EEG and lumbar MRI were all negative. Her mental status improved slightly over the course of her hospitalization. On the day of discharge, she is constantly mumbling, occasionally talking to people who aren't in the room, and calling her daughter "mother". At times, she is oriented x3; at other times she is oriented x1. This state comes after gradual improvement over the past 6 days.   The daughter will be caring for her at home and has been advised to orient frequently, develop routines, and position the patient near a window. Please continue to follow. If hallucinations persist, she may be a candidate for outpatient psychiatry follow-up; we did not feel that a psychiatry consult was appropriate at this time (as  the patient is actively improving).   2.  Labs / imaging needed at time of follow-up: CBC for white count  3.  Pending labs/ test needing follow-up: none  Follow-up Appointments:     Follow-up Information   Follow up with [facility]. (Office needs to approve and place you with a neurologist - they will call you to schedule an appointment)    Contact information:   [address] [address] [phone]      Follow up with PROCHNAU,CAROLINE, MD. (Please schedule a hospital follow-up with Dr. Laqueta Due when convenient in the coming 2 weeks)    Specialty:  Internal Medicine   Contact information:   [address] [place] [address] [phone]       Discharge  Instructions:  Please continue to orient [name] at home. Develop routines, and position the patient near a window. Encourage participation in occupational therapy and physical therapy.   [facility] will be calling to schedule an appointment once they review the chart.   Please also make an appointment with Dr. Laqueta Due (PCP) in the coming 1 week (office was closed today). Dr. Laqueta Due may consider outpatient psychiatry referral in the future if hallucinations persist.  Return to the hospital if your mom develops fever, suddenly increased confusion or pain.  Altered Mental Status Altered mental status most often refers to an abnormal change in your responsiveness and awareness. It can affect your speech, thought, mobility, memory, attention span, or alertness. It can range from slight confusion to complete unresponsiveness (coma). Altered mental status can be a sign of a serious underlying medical condition. Rapid evaluation and medical treatment is necessary for patients having an altered mental status. CAUSES   Low blood sugar (hypoglycemia) or diabetes.  Severe loss of body fluids (dehydration) or a body salt (electrolyte) imbalance.  A stroke or other neurologic problem, such as dementia or delirium.  A head injury or tumor.  A drug or alcohol overdose.  Exposure to toxins or poisons.  Depression, anxiety, and stress.  A low oxygen level (hypoxia).  An infection.  Blood loss.  Twitching or shaking (seizure).  Heart problems, such as heart attack or heart rhythm problems (arrhythmias).  A body temperature that is too low or too high (hypothermia or hyperthermia). DIAGNOSIS  A diagnosis is based on your history, symptoms, physical and neurologic examinations, and diagnostic tests. Diagnostic tests may include:  Measurement of your blood pressure, pulse, breathing, and oxygen levels (vital signs).  Blood tests.  Urine tests.  X-ray exams.  A computerized  magnetic scan (magnetic resonance imaging, MRI).  A computerized X-ray scan (computed tomography, CT scan). TREATMENT  Treatment will depend on the cause. Treatment may include:  Management of an underlying medical or mental health condition.  Critical care or support in the hospital. [place]   Only take over-the-counter or prescription medicines for pain, discomfort, or fever as directed by your caregiver.  Manage underlying conditions as directed by your caregiver.  Eat a healthy, well-balanced diet to maintain strength.  Join a support group or prevention program to cope with the condition or trauma that caused the altered mental status. Ask your caregiver to help choose a program that works for you.  Follow up with your caregiver for further examination, therapy, or testing as directed. SEEK MEDICAL CARE IF:   You feel unwell or have chills.  You or your family notice a change in your behavior or your alertness.  You  have trouble following your caregiver's treatment plan.  You have questions or concerns. SEEK IMMEDIATE MEDICAL CARE IF:   You have a rapid heartbeat or have chest pain.  You have difficulty breathing.  You have a fever.  You have a headache with a stiff neck.  You cough up blood.  You have blood in your urine or stool.  You have severe agitation or confusion. MAKE SURE YOU:   Understand these instructions.  Will watch your condition.  Will get help right away if you are not doing well or get worse. Document Released: 08/26/2009 Document Revised: 05/31/2011 Document Reviewed: 08/26/2009 [facility] Patient Information 2015 [place], [place]. This information is not intended to replace advice given to you by your health care provider. Make sure you discuss any questions you have with your health care provider.   Consultations: Treatment Team:  Melina Schools, MD  Procedures Performed:  Ct Head Wo Contrast  12/04/2013   CLINICAL  DATA:  Altered mental status  EXAM: CT HEAD WITHOUT CONTRAST  TECHNIQUE: Contiguous axial images were obtained from the base of the skull through the vertex without intravenous contrast.  COMPARISON:  12/21/2006  FINDINGS: Mild global atrophy. No mass effect, midline shift, or acute intracranial hemorrhage. Cranium is intact mastoid air cells are clear. Minimal mucus in the sphenoid sinus  IMPRESSION: No acute intracranial pathology.   Electronically Signed   By: Maryclare Bean M.D.   On: 12/04/2013 16:32   Ct Lumbar Spine Wo Contrast  12/05/2013   CLINICAL DATA:  Recent lumbar surgery. Altered mental status and fever, assess for abscess.  EXAM: CT LUMBAR SPINE WITHOUT CONTRAST  TECHNIQUE: Multidetector CT imaging of the lumbar spine was performed without intravenous contrast administration. Multiplanar CT image reconstructions were also generated.  COMPARISON:  Lumbar spine radiographs November 29, 2013 and CT lumbar spine October 05, 2013  FINDINGS: Transitional anatomy , lumbarized S1 vertebral body with small S1-2 disc. Status post L2 through L5 bilateral pedicle screw placed with bridging rods. Solid L5-S1 interbody fusion. Cystic change of the L5-S1 facets may reflect prior hardware failure or sequelae of hardware removal. Pedicle screw tracts at S1. L2 and L3 interbody disc material. Moderate to severe L4-5 disc degeneration, with grade 1 stable L4-5 anterolisthesis. No destructive bony lesions. Vacuum disc T12-L1 and L1-2.  Fluid along the bilateral posterior paraspinal surgical approach, with widely distributed in bone graft material. Free fluid and stranding about the LEFT psoas muscle likely reflecting surgical approach. Trace pneumoperitoneum LEFT abdomen.  No osseous canal stenosis at any level. Suspected moderate to severe at L3-4 and moderate L4-5 neural foraminal narrowing.  IMPRESSION: Limited assessment for abscess on this nonenhanced CT. Contrast enhanced imaging, specifically MRI with contrast would  be more sensitive.  Status post lumbar spine hardware revision, with new L2 through L5 pedicle screws, L2-3 and L3-4 interbody disc material, stable L4-5 anterolisthesis. LEFT psoas muscle stranding and effusion likely reflects surgical approach in addition to effusion along the posterior paraspinal surgical approach, with widely distributed posterior bone graft material.   Electronically Signed   By: Elon Alas   On: 12/05/2013 05:08   Mr Brain Wo Contrast  12/04/2013   CLINICAL DATA:  Altered mental status. Lumbar fusion September 2015.  EXAM: MRI HEAD WITHOUT CONTRAST  MRA HEAD WITHOUT CONTRAST  TECHNIQUE: Multiplanar, multiecho pulse sequences of the brain and surrounding structures were obtained without intravenous contrast. Angiographic images of the head were obtained using MRA technique without contrast.  COMPARISON:  MRI of  the head May 09, 2012 and CT of the head May 06, 2013 at 1627 hr  FINDINGS: MRI HEAD FINDINGS  No reduced diffusion to suggest acute ischemia. No susceptibility artifact to suggest hemorrhage.  The ventricles and the sulci are normal for patient's age. A few scattered subcentimeter supratentorial white matter T2 hyperintensities, less than expected for age suggest sequelae of chronic small vessel ischemic disease, unchanged. No midline shift or mass effect.  No abnormal extra-axial fluid collections. Major intracranial vascular flow voids observed at the skull base though, better characterized on an MRA of the head as below. Status post bilateral ocular lens implants. No paranasal sinus air-fluid levels ; minimal sphenoid mucosal thickening. Mastoid air cells are well aerated. Fluid replaced non expanded sella. No cerebellar tonsillar ectopia. No suspicious calvarial bone marrow signal. Patient is edentulous.  MRA HEAD FINDINGS  Anterior circulation: Normal flow related enhancement of the included cervical, petrous, cavernous and supra clinoid internal carotid  arteries. Patent anterior communicating artery. Normal flow related enhancement of the anterior and middle cerebral arteries, including more distal segments.  Posterior circulation: LEFT vertebral artery is dominant. Basilar artery is patent, with normal flow related enhancement of the main branch vessels. Bilateral posterior communicating arteries present with compensatory diminutive P1 segments. Normal flow related enhancement of the posterior cerebral arteries.  No large vessel occlusion, hemodynamically significant stenosis, abnormal luminal irregularity, aneurysm within the anterior nor posterior circulation.  IMPRESSION: MRI head: No acute intracranial process ; normal noncontrast MRI of the brain for age.  MRA head: No acute vascular process ; no hemodynamically significant stenosis. Complete circle of Willis.   Electronically Signed   By: Elon Alas   On: 12/04/2013 22:00   Mr Lumbar Spine W Wo Contrast  12/06/2013   CLINICAL DATA:  Recent back surgery. Confusion. Poor bowel control. Query cauda equina syndrome. Multilevel lumbar fusion. Urinary incontinence.  EXAM: MRI LUMBAR SPINE WITHOUT AND WITH CONTRAST  TECHNIQUE: Multiplanar and multiecho pulse sequences of the lumbar spine were obtained without and with intravenous contrast.  CONTRAST:  83m MULTIHANCE GADOBENATE DIMEGLUMINE 529 MG/ML IV SOLN  COMPARISON:  12/05/2013  FINDINGS: The lowest (and somewhat transitional) lumbar type non-rib-bearing vertebra is labeled as S1. The conus medullaris appears normal. Conus level: L1-2  Despite efforts by the technologist and patient, motion artifact is present on today's exam and could not be eliminated. This reduces exam sensitivity and specificity. No overt central narrowing of the thecal sac, lesion along the cauda equina, or mass lesion is identified to cause cauda equina syndrome. No clumping or abnormal enhancement of the nerve roots to favor arachnoiditis.  Small fluid collections with adjacent  muscular enhancement noted in the psoas muscles at the L2-3 and L3-4 levels on both sides, as shown on image 16 of series 11, favor visit postoperative fluid collections over psoas abscesses given the small size and thin marginal enhancement. There are also subcutaneous fluid collection posteriorly overlying the superficial fascia margin which have thin marginal enhancement, and which on the right side extend towards the posterolateral rod from the L3 level down through the L5-S1 level. Once again, I favor these is being routine postoperative fluid collections are rather than infection, although correlation with any clinical signs of infection should be considered.  As before, there is 6 mm of anterolisthesis at L4-5. No significant unexpected vertebral edema on the inversion recovery weighted images. On post-contrast images, to the extent of my ability to tell given the motion artifact, we do not observed  an epidural abscess.  Prior screw tracks from pedicle screws at S1 noted.  Additional findings at individual levels are as follows:  T11-12: Mild central narrowing of the thecal sac an borderline right foraminal stenosis due to disc bulge and facet arthropathy.  T12-L1: No impingement. Mild bilateral facet arthropathy and mild disc bulge.  L1-2:  No impingement.  Mild disc bulge.  L2-3: Borderline right foraminal stenosis due to facet arthropathy. Borderline central narrowing of the thecal sac due to short pedicles.  L3-4: Mild left foraminal stenosis due to facet and intervertebral spurring. Borderline right foraminal stenosis due to same.  L4-5: Borderline right foraminal stenosis due to facet arthropathy and the chronic subluxation at this level.  L5-S1: No impingement. Spurring along the fused facet joints. Mild posterior intervertebral spurring. Posterior decompression.  S1-2: Mild left foraminal stenosis due to intervertebral spurring , images 11-13 of series 3. Transitional S1 morphology.  IMPRESSION: 1.  Fluid collections posteriorly in the paraspinal region, tracking in the right posterior paraspinal musculature, and along the medial aspects of both psoas muscles are most likely postoperative rather than due to abscess/infection based on appearance, although correlation with any specific signs of infection should be considered. 2. No high-grade impingement to suggest a specific cause for cauda equina syndrome. No epidural abscess. 3. Spurring and (at unfused levels) degenerative disc disease contribute to mild impingement at the T11-12, L3-4, and S1-2 levels. There is borderline impingement at L2-3 and L4-5.   Electronically Signed   By: Sherryl Barters M.D.   On: 12/06/2013 15:04   Dg Chest Portable 1 View  12/04/2013   CLINICAL DATA:  Altered mental status. Hallucinations. Back surgery 1 week ago.  EXAM: PORTABLE CHEST -  COMPARISON:  None.  FINDINGS: Cardiopericardial silhouette within normal limits. Mediastinal contours normal. Trachea midline. No airspace disease or effusion. Low volume chest. Monitoring leads project over the chest.  IMPRESSION: No active disease.   Electronically Signed   By: Dereck Ligas M.D.   On: 12/04/2013 17:00   Mr Jodene Nam Head/brain Wo Cm  12/04/2013   CLINICAL DATA:  Altered mental status. Lumbar fusion September 2015.  EXAM: MRI HEAD WITHOUT CONTRAST  MRA HEAD WITHOUT CONTRAST  TECHNIQUE: Multiplanar, multiecho pulse sequences of the brain and surrounding structures were obtained without intravenous contrast. Angiographic images of the head were obtained using MRA technique without contrast.  COMPARISON:  MRI of the head May 09, 2012 and CT of the head May 06, 2013 at 1627 hr  FINDINGS: MRI HEAD FINDINGS  No reduced diffusion to suggest acute ischemia. No susceptibility artifact to suggest hemorrhage.  The ventricles and the sulci are normal for patient's age. A few scattered subcentimeter supratentorial white matter T2 hyperintensities, less than expected for age  suggest sequelae of chronic small vessel ischemic disease, unchanged. No midline shift or mass effect.  No abnormal extra-axial fluid collections. Major intracranial vascular flow voids observed at the skull base though, better characterized on an MRA of the head as below. Status post bilateral ocular lens implants. No paranasal sinus air-fluid levels ; minimal sphenoid mucosal thickening. Mastoid air cells are well aerated. Fluid replaced non expanded sella. No cerebellar tonsillar ectopia. No suspicious calvarial bone marrow signal. Patient is edentulous.  MRA HEAD FINDINGS  Anterior circulation: Normal flow related enhancement of the included cervical, petrous, cavernous and supra clinoid internal carotid arteries. Patent anterior communicating artery. Normal flow related enhancement of the anterior and middle cerebral arteries, including more distal segments.  Posterior circulation: LEFT  vertebral artery is dominant. Basilar artery is patent, with normal flow related enhancement of the main branch vessels. Bilateral posterior communicating arteries present with compensatory diminutive P1 segments. Normal flow related enhancement of the posterior cerebral arteries.  No large vessel occlusion, hemodynamically significant stenosis, abnormal luminal irregularity, aneurysm within the anterior nor posterior circulation.  IMPRESSION: MRI head: No acute intracranial process ; normal noncontrast MRI of the brain for age.  MRA head: No acute vascular process ; no hemodynamically significant stenosis. Complete circle of Willis.   Electronically Signed   By: Elon Alas   On: 12/04/2013 22:00   Admission HPI:  [name] is a 68 yo woman here with altered mental status and new onset hallucinations several days after a recent hospitalization for a spinal fusion for treatment of her lumbar stenosis. She was admitted for surgery on 11/28/13 and experienced some vague hallucinations after the surgery while still in the  hospital on 11/29/13 and 11/30/13 that were attributed to the anesthesia. Her pain was treated with percocet.   She was then transferred to a SNF on 9/11. That night, she called her daughter, reporting worsened hallucinations and the feeling that people at the SNF were "out to get her". Her daughter proceeded to take her out of the SNF a day later; she therefore spent last night at home. She slept for an abnormally long time last night and was then mumbling and difficult to follow upon awakening this morning. Per her daughters, she has become more and more disoriented and her mumbling has worsened since this morning. She has denied any pain or other symptoms throughout. Her last dose of percocet was on 12/01/13.  Of note, her daughters add that she was very unhappy to be placed in the nursing home area of the SNF, rather than in the rehab hall.   Admission Physical Exam: Blood pressure 142/66, pulse 86, temperature 101.1 F (38.4 C), temperature source Rectal, resp. rate 22, height 5' 2" (1.575 m), weight 231 lb (104.781 kg), SpO2 98.00%.  Appearance: lying in bed with eyes closed, 3 anxious, teary daughters at bedside  HEENT: AT/[place], PERRL, no nystagmus, EOMi, pterygium medially OS, moist mucous membranes, no lymphadenopathy  Heart: RRR, normal S1S2, no MRG  Lungs: CTAB, shallow, rapid breathing when upset  Abdomen: BS+, soft, slightly tender in RLQ on deep palpation  Musculoskeletal: no joint swelling or tenderness  Extremities: no edema in lower extremities  Neurologic: tongue has rapid movements up and down (no palatal nystagmus), A&Ox2 (could not identify the date), CN II-XII intact, FNF intact, strength and sensation intact, did not observe gait  Psychiatric: appears upset, occasionally bursts out that "you (her daughter) should have gotten me (from the SNF) earlier!"  Skin: no rashes, well-healing wound, noneyrthematous, mildly tender  Hospital Course by problem list: Active Problems:    Altered mental status In summary, [name] is a 68 yo woman who was hospitalized last week for a lumbar spinal fusion surgery, then developed altered mental status. Report gathered from her daughters throughout this hospitalization revealed that they had felt she was back to baseline right after the surgery. However, she then had visual hallucinations while still in the hospital last week. Per daughters' report, these were attributed to her anesthesia, and she was transferred to a rehab facility. She found her time at SNF to be somewhat traumatic, calling her daughters to come get her and exhibiting some paranoid thoughts about the workers there.   During this hospitalization, a cause for  her AMS was not identified; however, her general affect, ability to communicate and quality of voice have improved somewhat during this hospitalization. At discharge, her daughters reported that she was near her post-operative in-hospital state (when she was having hallucinations last week), but was nowhere near her pre-surgical cognitive baseline. A psychiatry consult was considered, but the daughters agree that allowing [name] a chance to be in familiar, home surroundings and to continue to improve at home is a better first step; they will follow up with PCP next week and pursue outpatient psychiatry in the future if indicated by PCP.  Altered Mental Status: Since being on our service, [name] has gone in and out of alertness and in and out of orientation. She initially had a fever to 101.1 with leukocytosis to 12.7, but both downtrended (now 57F and 10.9). Her ESR and CRP were also slightly elevated. No source of infection was identified through urinalysis, chest xray, blood cultures and orthopaedic assessment via MRI of the surgical site. These small elevations in temperature, white count, ESR and CRP are common after surgery. Head CT, brain MRA/MRI and EEG were also negative. Lactic acid, ammonia, TSH and LFTs  are WNL. HgbA1c 6.2%. Her only new medication after surgery was percocet; this was last taken 3 days prior to admission. She had been kept off of her Xanax for several days (normally takes 0.76m TID PRN); this was resumed on day 2 of admission. By the end of the hospitalization, her ability to speak had improved, but she started to initiate sentences normally, then trail off. Her voice was noted to be shaky. This is far from her baseline as per her daughters and the occupational therapist who worked with her last week.  Hallucinations: The patient described some hallucinations and was found to be talking to someone who was not in the room. She also displayed some paranoid thinking at the SNF and in the beginning of this hospitalization.  Recent Lumbar Spinal Stenosis: Lumbar CT and MRI with no sign of abscess or infection.  Discharge Vitals:   BP 138/81  Pulse 105  Temp(Src) 99 F (37.2 C) (Oral)  Resp 18  Ht 5' 2" (1.575 m)  Wt 231 lb (104.781 kg)  BMI 42.24 kg/m2  SpO2 99%  Pertinent Lab Values: WBC at discharge: 10.9 ESR: 78 CRP: 2.2 TSH: 2.4 HgbA1c: 6.2% Lactic Acid: 1.48 Ammonia: 16  Signed: JDrucilla Schmidt MD 12/07/2013, 1:14 PM    Services Ordered on Discharge: home health PT and OT Equipment Ordered on Discharge: lumbar corset brace

## 2013-12-07 NOTE — Progress Notes (Signed)
CSW (Clinical Child psychotherapist) spoke with pt family and confirmed prior to admission pt was living with her daughter [name]. Family would like for pt to dc back home with daughter and home health services. CSW notified RNCM. At this time, pt has no hospital social work needs.  Aleysha Meckler, LCSWA [phone]

## 2013-12-07 NOTE — Progress Notes (Signed)
    Subjective:      Patient reports pain as 2 on 0-10 scale.  Reports decreased leg pain reports incisional back pain   Positive void Positive bowel movement Positive flatus Negative chest pain or shortness of breath  Objective: Vital signs in last 24 hours: Temp:  [97.5 F (36.4 C)-99 F (37.2 C)] 99 F (37.2 C) (09/18 0432) Pulse Rate:  [98-105] 105 (09/18 0432) Resp:  [17-27] 18 (09/18 0432) BP: (107-162)/(56-130) 138/81 mmHg (09/18 0432) SpO2:  [97 %-100 %] 99 % (09/18 0432)  Intake/Output from previous day: 09/17 0701 - 09/18 0700 In: 120 [P.O.:120] Out: 400 [Urine:400]  Labs:  Recent Labs  12/06/13 0336 12/07/13 0500  WBC 12.6* 10.9*  RBC 3.40* 3.38*  HCT 29.9* 30.7*  PLT 348 341    Recent Labs  12/05/13 0758 12/06/13 0336  NA 136* 139  K 3.3* 3.7  CL 99 101  CO2 24 20  BUN 10 13  CREATININE 0.84 0.80  GLUCOSE 119* 108*  CALCIUM 8.5 8.8   No results found for this basename: LABPT, INR,  in the last 72 hours  Physical Exam: Neurologically intact Intact pulses distally Incision: dressing C/D/I Compartment soft  Assessment/Plan: Patient stable  xrays n/a Continue mobilization with physical therapy Continue care  Up with therapy Mental status changes persist.  Improved from admission but patient still demonstrates changes No evidence of spinal infection or cauda equina D/C planning per medicine F/U 1 week for wound check Continue frequent dressing changes  Venita Lick, MD [facility] [phone]

## 2013-12-07 NOTE — Progress Notes (Signed)
  Date: 12/07/2013  Patient name: [name]  Medical record number: [id number]  Date of birth: 1945/08/18   This patient has been seen and the plan of care was discussed with the house staff. Please see their note for complete details. I concur with their findings with the following additions/corrections: Ms Lemme is not back to pre-surgery level but is improved since admit. Her sxs fluctuate throughout the day. Her W/U has been extensive but unrevealing. The only remaining W/U would be a pysch eval. I agree with Dr Leatha Gilding that putting her on antipsychotics at this point is not advised as we hope these changes are temp and will resolve. I agree that a stable and familiar environment might hasten improvement and therefore, she will be D./C home.   Burns Spain, MD 12/07/2013, 5:29 PM

## 2013-12-07 NOTE — Progress Notes (Addendum)
Subjective: [name] feels "fine" this morning. Denies pain.  No episodes of urinary or fecal incontinence overnight.   Events: patient went to MRI yesterday morning and was unable to tolerate the procedure; she was reported to have had an episode of both urinary and fecal incontinence at some point during the outing. In discussion with the family, they believe she actually asked to go to the bathroom, but was not taken; they do not believe she actually lost continence. However, given the report, she was sent back for MRI over concern for cauda equina syndrome.   Objective: Vital signs in last 24 hours: Filed Vitals:   12/06/13 2016 12/06/13 2305 12/07/13 0007 12/07/13 0432  BP: 136/56 132/60 107/67 138/81  Pulse:  105 98 105  Temp:  98.6 F (37 C) 98.9 F (37.2 C) 99 F (37.2 C)  TempSrc:  Oral Oral Oral  Resp: 17 18 18 18   Height:      Weight:      SpO2:  98% 98% 99%   Weight change:   Intake/Output Summary (Last 24 hours) at 12/07/13 1135 Last data filed at 12/06/13 2130  Gross per 24 hour  Intake    120 ml  Output    400 ml  Net   -280 ml   Physical Exam: Appearance: lying in bed with eyes open, daughter at bedside applying lotion to her mom's legs HEENT: diaphoretic, AT/[place], PERRL, no nystagmus, EOMi, pterygium medially OS, moist mucous membranes, no lymphadenopathy  Heart: RRR, normal S1S2, no MRG  Lungs: CTAB, no wheezes Abdomen: BS+, soft, nontender   Musculoskeletal: no joint swelling or tenderness  Extremities: no edema in lower extremities  Neurologic: tongue initially had rapid movements up and down (without palatal nystagmus); this has now stopped. Quality of voice was initially very shaky; now more clear, easier to understand. A&Ox3 (states that the date is September 13th, but knows name, that it is 41, she is at [employer], Mady Gemma is president). CN II-XII intact, FNF intact, strength and sensation intact, have not observed gait (patient does not have her  home brace here in the hospital) Psychiatric: on initial exam, occasionally bursts out that "you (her daughter) should have gotten me (from the SNF) earlier!". Per report, exhibited paranoid behavior regarding medical care on day 2 of admission as well; none since. Today, patient starting sentences with some clarity, then trailing off, mumbling Skin: no rashes, clean and dry wound, noneyrthematous, mildly tender  Lab Results: Basic Metabolic Panel:  Recent Labs Lab 12/05/13 0758 12/06/13 0336  NA 136* 139  K 3.3* 3.7  CL 99 101  CO2 24 20  GLUCOSE 119* 108*  BUN 10 13  CREATININE 0.84 0.80  CALCIUM 8.5 8.8   Liver Function Tests:  Recent Labs Lab 12/04/13 1217  AST 47*  ALT 51*  ALKPHOS 84  BILITOT 1.0  PROT 7.5  ALBUMIN 3.5    Recent Labs Lab 12/04/13 1708  AMMONIA 16   CBC:  Recent Labs Lab 12/05/13 0758 12/05/13 1120 12/06/13 0336 12/07/13 0500  WBC 12.7* 12.1* 12.6* 10.9*  NEUTROABS  --  9.0*  --   --   HGB 9.5* 9.6* 10.0* 10.1*  HCT 28.2* 29.0* 29.9* 30.7*  MCV 87.6 87.6 87.9 90.8  PLT 318 304 348 341   CBG:  Recent Labs Lab 12/06/13 0412 12/06/13 0746 12/06/13 1153 12/06/13 1520 12/06/13 2016 12/07/13 0754  GLUCAP 120* 103* 160* 118* 116* 127*   Fasting Lipid Panel:  Recent Labs  Lab 12/05/13 0213  CHOL 158  HDL 46  LDLCALC 93  TRIG 97  CHOLHDL 3.4   Thyroid Function Tests:  Recent Labs Lab 12/04/13 1708  TSH 2.400   Urine Drug Screen: Drugs of Abuse     Component Value Date/Time   LABOPIA NONE DETECTED 12/04/2013 1503   COCAINSCRNUR NONE DETECTED 12/04/2013 1503   LABBENZ NONE DETECTED 12/04/2013 1503   AMPHETMU NONE DETECTED 12/04/2013 1503   THCU NONE DETECTED 12/04/2013 1503   LABBARB NONE DETECTED 12/04/2013 1503    Alcohol Level: No results found for this basename: ETH,  in the last 168 hours Patient's daughter confirms that she does not drink alcohol  Urinalysis:  Recent Labs Lab 12/04/13 1503  COLORURINE  YELLOW  LABSPEC 1.015  PHURINE 7.5  GLUCOSEU NEGATIVE  HGBUR NEGATIVE  BILIRUBINUR NEGATIVE  KETONESUR 15*  PROTEINUR NEGATIVE  UROBILINOGEN 0.2  NITRITE NEGATIVE  LEUKOCYTESUR NEGATIVE   Micro Results: Blood culture x2: NGTD  Studies/Results: Mr Lumbar Spine W Wo Contrast  12/06/2013   CLINICAL DATA:  Recent back surgery. Confusion. Poor bowel control. Query cauda equina syndrome. Multilevel lumbar fusion. Urinary incontinence.  EXAM: MRI LUMBAR SPINE WITHOUT AND WITH CONTRAST  TECHNIQUE: Multiplanar and multiecho pulse sequences of the lumbar spine were obtained without and with intravenous contrast.  CONTRAST:  60m MULTIHANCE GADOBENATE DIMEGLUMINE 529 MG/ML IV SOLN  COMPARISON:  12/05/2013  FINDINGS: The lowest (and somewhat transitional) lumbar type non-rib-bearing vertebra is labeled as S1. The conus medullaris appears normal. Conus level: L1-2  Despite efforts by the technologist and patient, motion artifact is present on today's exam and could not be eliminated. This reduces exam sensitivity and specificity. No overt central narrowing of the thecal sac, lesion along the cauda equina, or mass lesion is identified to cause cauda equina syndrome. No clumping or abnormal enhancement of the nerve roots to favor arachnoiditis.  Small fluid collections with adjacent muscular enhancement noted in the psoas muscles at the L2-3 and L3-4 levels on both sides, as shown on image 16 of series 11, favor visit postoperative fluid collections over psoas abscesses given the small size and thin marginal enhancement. There are also subcutaneous fluid collection posteriorly overlying the superficial fascia margin which have thin marginal enhancement, and which on the right side extend towards the posterolateral rod from the L3 level down through the L5-S1 level. Once again, I favor these is being routine postoperative fluid collections are rather than infection, although correlation with any clinical signs of  infection should be considered.  As before, there is 6 mm of anterolisthesis at L4-5. No significant unexpected vertebral edema on the inversion recovery weighted images. On post-contrast images, to the extent of my ability to tell given the motion artifact, we do not observed an epidural abscess.  Prior screw tracks from pedicle screws at S1 noted.  Additional findings at individual levels are as follows:  T11-12: Mild central narrowing of the thecal sac an borderline right foraminal stenosis due to disc bulge and facet arthropathy.  T12-L1: No impingement. Mild bilateral facet arthropathy and mild disc bulge.  L1-2:  No impingement.  Mild disc bulge.  L2-3: Borderline right foraminal stenosis due to facet arthropathy. Borderline central narrowing of the thecal sac due to short pedicles.  L3-4: Mild left foraminal stenosis due to facet and intervertebral spurring. Borderline right foraminal stenosis due to same.  L4-5: Borderline right foraminal stenosis due to facet arthropathy and the chronic subluxation at this level.  L5-S1: No impingement. Spurring  along the fused facet joints. Mild posterior intervertebral spurring. Posterior decompression.  S1-2: Mild left foraminal stenosis due to intervertebral spurring , images 11-13 of series 3. Transitional S1 morphology.  IMPRESSION: 1. Fluid collections posteriorly in the paraspinal region, tracking in the right posterior paraspinal musculature, and along the medial aspects of both psoas muscles are most likely postoperative rather than due to abscess/infection based on appearance, although correlation with any specific signs of infection should be considered. 2. No high-grade impingement to suggest a specific cause for cauda equina syndrome. No epidural abscess. 3. Spurring and (at unfused levels) degenerative disc disease contribute to mild impingement at the T11-12, L3-4, and S1-2 levels. There is borderline impingement at L2-3 and L4-5.   Electronically Signed    By: Sherryl Barters M.D.   On: 12/06/2013 15:04   EEG 12/05/13: normal recording with no epileptiform discharges and no abnormal slowing of cerebral activity.  Medications: I have reviewed the patient's current medications. Scheduled Meds: . ALPRAZolam  0.5 mg Oral Daily  . aspirin EC  81 mg Oral Daily  . fentaNYL  50 mcg Intravenous Once  . heparin  5,000 Units Subcutaneous 3 times per day  . levothyroxine  75 mcg Oral QAC breakfast   Continuous Infusions:  PRN Meds:.acetaminophen, acetaminophen, ALPRAZolam, metoprolol Assessment/Plan: Active Problems:   Altered mental status  Ms. Steffenhagen is a 68 yo woman who was hospitalized last week for a lumbar spinal fusion surgery, then developed altered mental status. By report from her daughters, she seemed like she was back to baseline right after the surgery. However, she then had visual hallucinations while still in the hospital last week. Per daughters' report, these were attributed to her anesthesia, and she was transferred to a rehab facility. She found her time at SNF to be somewhat traumatic, calling her daughters to come get her, exhibiting some paranoid thoughts about the workers there.   Since being on our service, she has gone in and out of alertness and in and out of orientation. She initially had a fever to 101.1 with leukocytosis to 12.7, but both downtrended (now 45F and 10.9) and no source of infection was identified (with urinalysis, chest xray, blood cultures and orthopaedic assessment via MRI of the surgical site). Head CT, brain MRA/MRI and EEG were also negative. She has also displayed some paranoid behavior. Her general affect, ability to communicate and quality of voice, however, have gradually improved during this hospitalization. Her daughters report that she is near her post-operative in-hospital state (when she was having hallucinations), but is nowhere near her pre-surgical cognitive baseline. Most notably, she continues to  see people in the room who are not there and to talk to them; she also starts sentences normally, then trails off.   Altered Mental Status: Infectious cause unlikely with downtrending WBC and fever curve while patient has been off of antibiotics. Lactic acid, ammonia, ESR, TSH and LFTs are WNL. HgbA1c 6.2%. MRI/MRA brain, head CT and EEG normal. ESR/CRP elevated (can be expected after orthopaedic surgery). - Appreciate Neurology consult to evaluate for seizure and rule out encephalopathy; attribute AMS to medications, possible withdrawal from benzodiazepine. Xanax has been resumed. Signed off. - Neuro checks q4 hours; notify MD of MS changes  - Blood cultures x2 NGTD  - Urine culture no growth - Continue aspirin  Hallucinations:  - Reorient frequently   Recent Lumbar Spinal Stenosis: Lumbar CT and MRI with no sign of abscess - Wound care  - Appreciate Orthopaedics consult -  Patient given new lumbar corset brace today (home brace is broken) - Appreciate OT and PT input and consults  Hypokalemia: Corrected with potassium chloride 10 meq; 3.4-->3.3-->3.7   Diet:  - handling regular diet well  DVT Ppx:  - [place] heparin  Dispo: Disposition is deferred at this time, awaiting improvement of current medical problems.  Anticipated discharge in approximately 0 day(s).   The patient does have a current PCP Ernestene Kiel, MD) and does not need an [facility] hospital follow-up appointment after discharge.  The patient does have transportation limitations that hinder transportation to clinic appointments.  .Services Needed at time of discharge: Y = Yes, Blank = No PT:   OT:   RN:   Equipment:   Other:     LOS: 3 days   Drucilla Schmidt, MD 12/07/2013, 11:35 AM

## 2013-12-07 NOTE — Progress Notes (Signed)
Occupational Therapy Treatment Patient Details Name: [name] MRN: [id number] DOB: 1945/06/17 Today's Date: 12/07/2013    History of present illness [name] is an 68 y.o. female admitted with altered mental status with confusion and hallucinations. She status post lumbar fusion on 11/28/13.   OT comments  Pt. Is still confused and required consistent redirection to stay on task. Pt. Was able to state 2 out of 3 back precautions but were not following them during ADLs and mobility. Pt. And dtr. Were ed. On use of AE for LE ADLs. Pt. Dtr. Expressed understanding. Pt. Would benefit from further OT to maximize understanding.   Follow Up Recommendations       Equipment Recommendations       Recommendations for Other Services      Precautions / Restrictions Precautions Precautions: Back Precaution Booklet Issued: Yes (comment) Required Braces or Orthoses: Spinal Brace (spinal brace not in room) Spinal Brace: Lumbar corset;Applied in sitting position Restrictions Weight Bearing Restrictions: No       Mobility Bed Mobility       Sidelying to sit: Min assist   Sit to supine: Mod assist   General bed mobility comments: Pt. requires cues for sequencing of task.   Transfers                 General transfer comment: Pt. was Min A wiith stand pivot trasnfer bed to chair.     Balance                                   ADL       Grooming: Wash/dry hands;Wash/dry face;Supervision/safety       Lower Body Bathing: Maximal assistance   Upper Body Dressing : Maximal assistance Upper Body Dressing Details (indicate cue type and reason):  (pt. and dtr. ed. on dressing equipment.)                   General ADL Comments: Pt. and dtr ed. on dressing equipemnt for ADLs.       Vision                     Perception     Praxis      Cognition   Behavior During Therapy: [facility] for tasks assessed/performed Overall Cognitive  Status: Impaired/Different from baseline Area of Impairment: Orientation;Attention;Memory;Safety/judgement;Awareness Orientation Level: Place;Time;Situation Current Attention Level: Focused Memory: Decreased recall of precautions  Following Commands: Follows one step commands consistently Safety/Judgement: Decreased awareness of safety;Decreased awareness of deficits Awareness: Intellectual Problem Solving: Slow processing General Comments:  (Pt. requires consistanat redirection for task.)    Extremity/Trunk Assessment               Exercises     Shoulder Instructions       General Comments      Pertinent Vitals/ Pain       Pain Assessment: 0-10 Pain Score: 2  Pain Location:  (back) Pain Descriptors / Indicators: Aching Pain Intervention(s): Patient requesting pain meds-RN notified  Home Living                                          Prior Functioning/Environment              Frequency       Progress  Toward Goals  OT Goals(current goals can now be found in the care plan section)  Progress towards OT goals: Progressing toward goals     Plan      Co-evaluation                 End of Session Equipment Utilized During Treatment: Rolling walker;Back brace   Activity Tolerance Patient tolerated treatment well   Patient Left in bed;with call bell/phone within reach;with family/visitor present   Nurse Communication          Time: 1220-1315 OT Time Calculation (min): 55 min  Charges: OT General Charges $OT Visit: 1 Procedure OT Treatments $Self Care/Home Management : 38-52 mins $Therapeutic Activity: 8-22 mins  Graiden Henes 12/07/2013, 1:15 PM

## 2013-12-08 NOTE — ED Provider Notes (Signed)
Medical screening examination/treatment/procedure(s) were conducted as a shared visit with non-physician practitioner(s) and myself.  I personally evaluated the patient during the encounter.   EKG Interpretation   Date/Time:  Tuesday December 04 2013 12:10:03 EDT Ventricular Rate:  114 PR Interval:  146 QRS Duration: 86 QT Interval:  362 QTC Calculation: 498 R Axis:   94 Text Interpretation:  Sinus tachycardia Possible Left atrial enlargement  Rightward axis Cannot rule out Anterior infarct , age undetermined  Abnormal ECG ST/T changes improved from 2006 Confirmed by Alegandro Macnaughton  MD,  Abrial Arrighi [phone]) on 12/04/2013 1:51:38 PM       Patient with altered metal status, worsening over the last few days. This all started postop but is significantly worse now. Has changes in her speech but no focal deficits. Patient is lethargic but arousable and confused. During the workup it was found she also has a fever 3 rectal temperature. Given this there is no concern for possible postop infection. Urine and chest are negative. Care transferred with CT lumbar spine pending, will need admission as well.  Audree Camel, MD 12/08/13 [phone]

## 2013-12-10 ENCOUNTER — Encounter (HOSPITAL_COMMUNITY): Payer: Self-pay | Admitting: Orthopedic Surgery

## 2013-12-10 LAB — CULTURE, BLOOD (ROUTINE X 2)
CULTURE: NO GROWTH
Culture: NO GROWTH

## 2013-12-24 ENCOUNTER — Telehealth: Payer: Self-pay | Admitting: Neurology

## 2013-12-24 NOTE — Telephone Encounter (Signed)
Pt called and states that she is feeling better and soes not want to come in for her new patient appt  On 01-11-14 notified the referring dr office as well

## 2014-01-11 ENCOUNTER — Ambulatory Visit: Payer: Medicare HMO | Admitting: Neurology

## 2014-03-24 ENCOUNTER — Emergency Department (HOSPITAL_COMMUNITY)
Admission: EM | Admit: 2014-03-24 | Discharge: 2014-03-25 | Disposition: A | Discharge status: Home / Self Care | Payer: Medicare HMO | Attending: Emergency Medicine | Admitting: Emergency Medicine

## 2014-03-24 DIAGNOSIS — E039 Hypothyroidism, unspecified: Secondary | ICD-10-CM | POA: Insufficient documentation

## 2014-03-24 DIAGNOSIS — M1990 Unspecified osteoarthritis, unspecified site: Secondary | ICD-10-CM | POA: Insufficient documentation

## 2014-03-24 DIAGNOSIS — R519 Headache, unspecified: Secondary | ICD-10-CM

## 2014-03-24 DIAGNOSIS — F419 Anxiety disorder, unspecified: Secondary | ICD-10-CM | POA: Diagnosis not present

## 2014-03-24 DIAGNOSIS — F329 Major depressive disorder, single episode, unspecified: Secondary | ICD-10-CM | POA: Diagnosis not present

## 2014-03-24 DIAGNOSIS — I10 Essential (primary) hypertension: Secondary | ICD-10-CM | POA: Diagnosis not present

## 2014-03-24 DIAGNOSIS — R000 Tachycardia, unspecified: Secondary | ICD-10-CM | POA: Insufficient documentation

## 2014-03-24 DIAGNOSIS — R51 Headache: Secondary | ICD-10-CM | POA: Diagnosis present

## 2014-03-24 DIAGNOSIS — Z79899 Other long term (current) drug therapy: Secondary | ICD-10-CM | POA: Diagnosis not present

## 2014-03-24 DIAGNOSIS — G8929 Other chronic pain: Secondary | ICD-10-CM | POA: Diagnosis not present

## 2014-03-24 DIAGNOSIS — Z8719 Personal history of other diseases of the digestive system: Secondary | ICD-10-CM | POA: Diagnosis not present

## 2014-03-25 ENCOUNTER — Encounter (HOSPITAL_COMMUNITY): Payer: Self-pay | Admitting: Emergency Medicine

## 2014-03-25 LAB — CBC WITH DIFFERENTIAL/PLATELET
BASOS ABS: 0 10*3/uL (ref 0.0–0.1)
Basophils Relative: 0 % (ref 0–1)
Eosinophils Absolute: 0.1 10*3/uL (ref 0.0–0.7)
Eosinophils Relative: 1 % (ref 0–5)
HCT: 37.4 % (ref 36.0–46.0)
HEMOGLOBIN: 12.1 g/dL (ref 12.0–15.0)
LYMPHS PCT: 13 % (ref 12–46)
Lymphs Abs: 1.4 10*3/uL (ref 0.7–4.0)
MCH: 28.5 pg (ref 26.0–34.0)
MCHC: 32.4 g/dL (ref 30.0–36.0)
MCV: 88.2 fL (ref 78.0–100.0)
MONOS PCT: 9 % (ref 3–12)
Monocytes Absolute: 1 10*3/uL (ref 0.1–1.0)
NEUTROS ABS: 8.5 10*3/uL — AB (ref 1.7–7.7)
Neutrophils Relative %: 77 % (ref 43–77)
Platelets: 300 10*3/uL (ref 150–400)
RBC: 4.24 MIL/uL (ref 3.87–5.11)
RDW: 13.3 % (ref 11.5–15.5)
WBC: 11.1 10*3/uL — AB (ref 4.0–10.5)

## 2014-03-25 LAB — I-STAT CHEM 8, ED
BUN: 6 mg/dL (ref 6–23)
CHLORIDE: 94 meq/L — AB (ref 96–112)
Calcium, Ion: 1.16 mmol/L (ref 1.13–1.30)
Creatinine, Ser: 0.7 mg/dL (ref 0.50–1.10)
GLUCOSE: 207 mg/dL — AB (ref 70–99)
HEMATOCRIT: 38 % (ref 36.0–46.0)
Hemoglobin: 12.9 g/dL (ref 12.0–15.0)
Potassium: 3.2 mmol/L — ABNORMAL LOW (ref 3.5–5.1)
Sodium: 135 mmol/L (ref 135–145)
TCO2: 26 mmol/L (ref 0–100)

## 2014-03-25 LAB — SEDIMENTATION RATE: SED RATE: 23 mm/h — AB (ref 0–22)

## 2014-03-25 MED ORDER — DIPHENHYDRAMINE HCL 50 MG/ML IJ SOLN
12.5000 mg | Freq: Once | INTRAMUSCULAR | Status: AC
  Administered 2014-03-25: 12.5 mg via INTRAVENOUS
  Filled 2014-03-25: qty 1

## 2014-03-25 MED ORDER — KETOROLAC TROMETHAMINE 30 MG/ML IJ SOLN
30.0000 mg | Freq: Once | INTRAMUSCULAR | Status: AC
  Administered 2014-03-25: 30 mg via INTRAVENOUS
  Filled 2014-03-25: qty 1

## 2014-03-25 MED ORDER — SODIUM CHLORIDE 0.9 % IV SOLN
Freq: Once | INTRAVENOUS | Status: AC
  Administered 2014-03-25: 1000 mL via INTRAVENOUS

## 2014-03-25 MED ORDER — METOCLOPRAMIDE HCL 5 MG/ML IJ SOLN
10.0000 mg | Freq: Once | INTRAMUSCULAR | Status: AC
  Administered 2014-03-25: 10 mg via INTRAVENOUS
  Filled 2014-03-25: qty 2

## 2014-03-25 NOTE — ED Notes (Signed)
Denies nausea. Pt states "it is not throbbing like it was".

## 2014-03-25 NOTE — ED Notes (Signed)
Pt arrived to the ED with a complaint of a headache. Pt states the pain is located in the left side radiating to the left side of her posterior neck area.  Pt states she has had this pain previously 40 years ago which was due to anxiety.  Pt states she has had stress in her life lately.  Pt took a xanax around 1830 hrs last evening.

## 2014-03-25 NOTE — ED Provider Notes (Signed)
CSN: [id number]     Arrival date & time 03/24/14  2348 History   First MD Initiated Contact with Patient 03/25/14 0040     Chief Complaint  Patient presents with  . Headache     (Consider location/radiation/quality/duration/timing/severity/associated sxs/prior Treatment) HPI Comments: This is 69-year-old female who started with a left-sided headache on Saturday afternoon after a stressful event.  Patient states that she's had headaches in the past related to stress, but it's been a number of years.  She has tried taking her normal arthritis medication which includes Ultram and Robaxin without any relief.  She also tried a Xanax which she normally takes at bedtime to help her sleep without any relief.  She states the headache is isolated to the left temporal and occipital area, occasionally with neck movement.  It will radiate to the left neck.  Denies any recent illnesses  Patient is a 69 y.o. female presenting with headaches. The history is provided by the patient.  Headache Pain location:  L parietal and L temporal Quality:  Sharp Radiates to:  L neck Severity currently:  9/10 Severity at highest:  10/10 Onset quality:  Gradual Duration:  2 days Timing:  Constant Progression:  Worsening Chronicity:  Recurrent Similar to prior headaches: yes   Context: emotional stress   Relieved by:  Nothing Worsened by:  Neck movement Ineffective treatments:  Prescription medications Associated symptoms: nausea and neck pain   Associated symptoms: no back pain, no blurred vision, no congestion, no cough, no dizziness, no ear pain, no pain, no facial pain, no fever, no hearing loss, no numbness, no photophobia, no sinus pressure and no vomiting     Past Medical History  Diagnosis Date  . Hypertension   . Shortness of breath   . Depression   . Hypothyroidism   . Anxiety   . GERD (gastroesophageal reflux disease)   . History of blood transfusion 2004    "related to back OR"  . Arthritis    "multiple places; hands, feet, etc." (11/29/2013)  . Chronic lower back pain    Past Surgical History  Procedure Laterality Date  . Back surgery    . Carpal tunnel release Bilateral   . Posterior lumbar fusion  11/28/2013    "hardware removal; ~ 6 screws and 2 rods"  . Reduction mammaplasty    . Excisional hemorrhoidectomy    . Joint replacement    . Total knee arthroplasty Bilateral   . Tubal ligation    . Abdominal hysterectomy      partial  . Posterior lumbar fusion  2004    "placed screws and rods"  . Cataract extraction w/ intraocular lens  implant, bilateral Bilateral   . Anterior lat lumbar fusion N/A 11/28/2013    Procedure: XLIF L2-4 ;  Surgeon: Venita Lick, MD;  Location: [facility] OR;  Service: Orthopedics;  Laterality: N/A;   History reviewed. No pertinent family history. History  Substance Use Topics  . Smoking status: Never Smoker   . Smokeless tobacco: Never Used  . Alcohol Use: No   OB History    No data available     Review of Systems  Constitutional: Negative for fever.  HENT: Negative for congestion, ear pain, hearing loss, rhinorrhea and sinus pressure.   Eyes: Negative for blurred vision, photophobia, pain and visual disturbance.  Respiratory: Negative for cough.   Gastrointestinal: Positive for nausea. Negative for vomiting.  Musculoskeletal: Positive for neck pain. Negative for back pain.  Skin: Negative for rash and  wound.  Neurological: Positive for headaches. Negative for dizziness and numbness.      Allergies  Review of patient's allergies indicates no known allergies.  Home Medications   Prior to Admission medications   Medication Sig Start Date End Date Taking? Authorizing Provider  ALPRAZolam Prudy Feeler) 0.5 MG tablet Take 0.5 mg by mouth 3 (three) times daily as needed for anxiety.   Yes Historical Provider, MD  Calcium Carb-Cholecalciferol (CALCIUM 500 +D) 500-400 MG-UNIT TABS Take 1 tablet by mouth daily.   Yes Historical Provider, MD    docusate sodium (COLACE) 100 MG capsule Take 1 capsule (100 mg total) by mouth 2 (two) times daily. 11/30/13  Yes Zonia Kief, PA-C  famotidine (PEPCID) 20 MG tablet Take 20 mg by mouth daily.    Yes Historical Provider, MD  levothyroxine (SYNTHROID, LEVOTHROID) 75 MCG tablet Take 75 mcg by mouth daily before breakfast.   Yes Historical Provider, MD  magnesium oxide (MAG-OX) 400 MG tablet Take 400 mg by mouth daily.   Yes Historical Provider, MD  olmesartan-hydrochlorothiazide (BENICAR HCT) 40-25 MG per tablet Take 1 tablet by mouth daily.   Yes Historical Provider, MD  polyethylene glycol (MIRALAX / GLYCOLAX) packet Take 17 g by mouth daily. Patient taking differently: Take 17 g by mouth daily as needed for moderate constipation.  11/30/13  Yes Zonia Kief, PA-C  traMADol (ULTRAM) 50 MG tablet Take 50 mg by mouth every 6 (six) hours as needed for moderate pain.   Yes Historical Provider, MD  methocarbamol (ROBAXIN) 500 MG tablet Take 1 tablet (500 mg total) by mouth every 6 (six) hours as needed for muscle spasms. Patient not taking: Reported on 03/24/2014 11/30/13   Zonia Kief, PA-C  ondansetron (ZOFRAN ODT) 4 MG disintegrating tablet Take 1 tablet (4 mg total) by mouth every 8 (eight) hours as needed. Patient not taking: Reported on 03/25/2014 11/30/13   Zonia Kief, PA-C  oxyCODONE-acetaminophen (PERCOCET) 10-325 MG per tablet Take 1 tablet by mouth every 6 (six) hours as needed for pain. Patient not taking: Reported on 03/24/2014 11/30/13   Zonia Kief, PA-C   BP 136/65 mmHg  Pulse 117  Temp(Src) 99.2 F (37.3 C) (Oral)  Resp 18  SpO2 93% Physical Exam  Constitutional: She is oriented to person, place, and time. She appears well-developed and well-nourished.  HENT:  Head: Normocephalic.    Right Ear: External ear normal.  Left Ear: External ear normal.  Mouth/Throat: Oropharynx is clear and moist.  Eyes: Pupils are equal, round, and reactive to light.  Neck: Normal range of motion.   Cardiovascular: Regular rhythm.  Tachycardia present.   Pulmonary/Chest: Effort normal.  Musculoskeletal: Normal range of motion.  Lymphadenopathy:    She has no cervical adenopathy.  Neurological: She is alert and oriented to person, place, and time.  Skin: Skin is warm.  Nursing note and vitals reviewed.   ED Course  Procedures (including critical care time) Labs Review Labs Reviewed  SEDIMENTATION RATE - Abnormal; Notable for the following:    Sed Rate 23 (*)    All other components within normal limits  CBC WITH DIFFERENTIAL - Abnormal; Notable for the following:    WBC 11.1 (*)    Neutro Abs 8.5 (*)    All other components within normal limits  I-STAT CHEM 8, ED - Abnormal; Notable for the following:    Potassium 3.2 (*)    Chloride 94 (*)    Glucose, Bld 207 (*)    All other components within normal  limits    Imaging Review No results found.   EKG Interpretation None      MDM  Location of the headache is concerning for temporal arteritis.  Will obtain lab work Patient sedimentation rate is 23, which is well within normal parameters.  She did receive relief from the IV fluids, IV chart.  All Reglan and Benadryl Final diagnoses:  Acute nonintractable headache, unspecified headache type         Arman Filter, NP 03/25/14 0102  Tomasita Crumble, MD 03/25/14 1520

## 2014-03-25 NOTE — ED Notes (Signed)
Patient reports headache started Saturday afternoon while resting in the bed. Pt is experiencing left headache/neck pain. Tenderness noted to left neck and medial neck. Light sensitive. Nausea on yesterday evening but no nausea presently. Denies blurry vision or dizziness. Took Tramadol  at 20:00 last night with some relief.

## 2015-04-04 DIAGNOSIS — R42 Dizziness and giddiness: Secondary | ICD-10-CM | POA: Diagnosis not present

## 2015-04-04 DIAGNOSIS — Z79899 Other long term (current) drug therapy: Secondary | ICD-10-CM | POA: Diagnosis not present

## 2015-04-04 DIAGNOSIS — R002 Palpitations: Secondary | ICD-10-CM | POA: Diagnosis not present

## 2015-04-04 DIAGNOSIS — D649 Anemia, unspecified: Secondary | ICD-10-CM | POA: Diagnosis not present

## 2015-04-04 DIAGNOSIS — I10 Essential (primary) hypertension: Secondary | ICD-10-CM | POA: Diagnosis not present

## 2015-04-14 DIAGNOSIS — I6521 Occlusion and stenosis of right carotid artery: Secondary | ICD-10-CM | POA: Diagnosis not present

## 2015-04-14 DIAGNOSIS — R42 Dizziness and giddiness: Secondary | ICD-10-CM | POA: Diagnosis not present

## 2015-04-14 DIAGNOSIS — I10 Essential (primary) hypertension: Secondary | ICD-10-CM | POA: Diagnosis not present

## 2015-04-14 DIAGNOSIS — R002 Palpitations: Secondary | ICD-10-CM | POA: Diagnosis not present

## 2015-05-02 DIAGNOSIS — I10 Essential (primary) hypertension: Secondary | ICD-10-CM | POA: Diagnosis not present

## 2015-05-02 DIAGNOSIS — K219 Gastro-esophageal reflux disease without esophagitis: Secondary | ICD-10-CM | POA: Diagnosis not present

## 2015-05-02 DIAGNOSIS — M150 Primary generalized (osteo)arthritis: Secondary | ICD-10-CM | POA: Diagnosis not present

## 2015-05-02 DIAGNOSIS — E039 Hypothyroidism, unspecified: Secondary | ICD-10-CM | POA: Diagnosis not present

## 2015-06-06 DIAGNOSIS — Z1231 Encounter for screening mammogram for malignant neoplasm of breast: Secondary | ICD-10-CM | POA: Diagnosis not present

## 2016-09-28 ENCOUNTER — Other Ambulatory Visit: Payer: Self-pay | Admitting: Family Medicine

## 2016-09-28 DIAGNOSIS — Z1231 Encounter for screening mammogram for malignant neoplasm of breast: Secondary | ICD-10-CM

## 2016-10-07 ENCOUNTER — Ambulatory Visit: Payer: Medicare HMO

## 2016-10-13 ENCOUNTER — Ambulatory Visit
Admission: RE | Admit: 2016-10-13 | Discharge: 2016-10-13 | Disposition: A | Discharge status: Home / Self Care | Payer: Medicare Other | Source: Ambulatory Visit | Attending: Family Medicine | Admitting: Family Medicine

## 2016-10-13 DIAGNOSIS — Z1231 Encounter for screening mammogram for malignant neoplasm of breast: Secondary | ICD-10-CM

## 2017-11-11 ENCOUNTER — Other Ambulatory Visit: Payer: Self-pay | Admitting: Family Medicine

## 2017-11-11 DIAGNOSIS — Z1231 Encounter for screening mammogram for malignant neoplasm of breast: Secondary | ICD-10-CM

## 2017-12-09 ENCOUNTER — Ambulatory Visit
Admission: RE | Admit: 2017-12-09 | Discharge: 2017-12-09 | Disposition: A | Discharge status: Home / Self Care | Payer: Medicare Other | Source: Ambulatory Visit | Attending: Family Medicine | Admitting: Family Medicine

## 2017-12-09 DIAGNOSIS — Z1231 Encounter for screening mammogram for malignant neoplasm of breast: Secondary | ICD-10-CM

## 2018-01-18 ENCOUNTER — Other Ambulatory Visit: Payer: Self-pay | Admitting: Family Medicine

## 2018-01-18 DIAGNOSIS — E2839 Other primary ovarian failure: Secondary | ICD-10-CM

## 2018-03-24 ENCOUNTER — Other Ambulatory Visit: Payer: Medicare Other

## 2018-04-10 ENCOUNTER — Ambulatory Visit
Admission: RE | Admit: 2018-04-10 | Discharge: 2018-04-10 | Disposition: A | Discharge status: Home / Self Care | Payer: Medicare Other | Source: Ambulatory Visit | Attending: Family Medicine | Admitting: Family Medicine

## 2018-04-10 DIAGNOSIS — E2839 Other primary ovarian failure: Secondary | ICD-10-CM

## 2019-02-14 ENCOUNTER — Other Ambulatory Visit: Payer: Self-pay | Admitting: Family Medicine

## 2019-02-14 DIAGNOSIS — Z1231 Encounter for screening mammogram for malignant neoplasm of breast: Secondary | ICD-10-CM

## 2019-04-10 ENCOUNTER — Ambulatory Visit
Admission: RE | Admit: 2019-04-10 | Discharge: 2019-04-10 | Disposition: A | Discharge status: Home / Self Care | Payer: Medicare Other | Source: Ambulatory Visit | Attending: Family Medicine | Admitting: Family Medicine

## 2019-04-10 ENCOUNTER — Other Ambulatory Visit: Payer: Self-pay

## 2019-04-10 DIAGNOSIS — Z1231 Encounter for screening mammogram for malignant neoplasm of breast: Secondary | ICD-10-CM

## 2020-02-11 ENCOUNTER — Other Ambulatory Visit: Payer: Self-pay | Admitting: Family Medicine

## 2020-02-11 ENCOUNTER — Other Ambulatory Visit: Payer: Self-pay

## 2020-02-11 ENCOUNTER — Ambulatory Visit
Admission: RE | Admit: 2020-02-11 | Discharge: 2020-02-11 | Disposition: A | Discharge status: Home / Self Care | Payer: Medicare Other | Source: Ambulatory Visit | Attending: Family Medicine | Admitting: Family Medicine

## 2020-02-11 DIAGNOSIS — M549 Dorsalgia, unspecified: Secondary | ICD-10-CM

## 2020-02-12 ENCOUNTER — Other Ambulatory Visit: Payer: Self-pay | Admitting: Family Medicine

## 2020-02-12 DIAGNOSIS — Z1231 Encounter for screening mammogram for malignant neoplasm of breast: Secondary | ICD-10-CM

## 2020-02-26 ENCOUNTER — Other Ambulatory Visit: Payer: Self-pay | Admitting: Family Medicine

## 2020-02-26 DIAGNOSIS — M810 Age-related osteoporosis without current pathological fracture: Secondary | ICD-10-CM

## 2020-04-10 ENCOUNTER — Ambulatory Visit: Payer: Medicare Other

## 2020-06-04 ENCOUNTER — Other Ambulatory Visit: Payer: Medicare Other

## 2020-07-23 ENCOUNTER — Other Ambulatory Visit: Payer: Self-pay | Admitting: Family Medicine

## 2020-07-23 DIAGNOSIS — Z13820 Encounter for screening for osteoporosis: Secondary | ICD-10-CM

## 2020-07-28 ENCOUNTER — Ambulatory Visit
Admission: RE | Admit: 2020-07-28 | Discharge: 2020-07-28 | Disposition: A | Discharge status: Home / Self Care | Payer: Medicare Other | Source: Ambulatory Visit | Attending: Family Medicine | Admitting: Family Medicine

## 2020-07-28 ENCOUNTER — Other Ambulatory Visit: Payer: Self-pay

## 2020-07-28 DIAGNOSIS — Z1231 Encounter for screening mammogram for malignant neoplasm of breast: Secondary | ICD-10-CM

## 2020-07-28 DIAGNOSIS — Z13820 Encounter for screening for osteoporosis: Secondary | ICD-10-CM

## 2021-02-17 ENCOUNTER — Other Ambulatory Visit: Payer: Self-pay | Admitting: Family Medicine

## 2021-02-17 DIAGNOSIS — Z1231 Encounter for screening mammogram for malignant neoplasm of breast: Secondary | ICD-10-CM

## 2021-08-03 ENCOUNTER — Ambulatory Visit: Payer: Medicare Other

## 2021-08-07 ENCOUNTER — Ambulatory Visit
Admission: RE | Admit: 2021-08-07 | Discharge: 2021-08-07 | Disposition: A | Discharge status: Home / Self Care | Payer: Medicare Other | Source: Ambulatory Visit | Attending: Family Medicine | Admitting: Family Medicine

## 2021-08-07 DIAGNOSIS — Z1231 Encounter for screening mammogram for malignant neoplasm of breast: Secondary | ICD-10-CM

## 2021-11-30 ENCOUNTER — Ambulatory Visit: Payer: Medicare Other | Admitting: Physical Medicine and Rehabilitation

## 2021-12-31 DIAGNOSIS — E039 Hypothyroidism, unspecified: Secondary | ICD-10-CM | POA: Diagnosis not present

## 2021-12-31 DIAGNOSIS — Z23 Encounter for immunization: Secondary | ICD-10-CM | POA: Diagnosis not present

## 2021-12-31 DIAGNOSIS — I10 Essential (primary) hypertension: Secondary | ICD-10-CM | POA: Diagnosis not present

## 2022-01-21 DIAGNOSIS — R0609 Other forms of dyspnea: Secondary | ICD-10-CM | POA: Diagnosis not present

## 2022-02-17 DIAGNOSIS — D649 Anemia, unspecified: Secondary | ICD-10-CM | POA: Diagnosis not present

## 2022-02-17 DIAGNOSIS — Z0000 Encounter for general adult medical examination without abnormal findings: Secondary | ICD-10-CM | POA: Diagnosis not present

## 2022-02-17 DIAGNOSIS — Z23 Encounter for immunization: Secondary | ICD-10-CM | POA: Diagnosis not present

## 2022-02-17 DIAGNOSIS — R7303 Prediabetes: Secondary | ICD-10-CM | POA: Diagnosis not present

## 2022-02-17 DIAGNOSIS — M545 Low back pain, unspecified: Secondary | ICD-10-CM | POA: Diagnosis not present

## 2022-02-17 DIAGNOSIS — E039 Hypothyroidism, unspecified: Secondary | ICD-10-CM | POA: Diagnosis not present

## 2022-02-17 DIAGNOSIS — I10 Essential (primary) hypertension: Secondary | ICD-10-CM | POA: Diagnosis not present

## 2022-02-17 DIAGNOSIS — I350 Nonrheumatic aortic (valve) stenosis: Secondary | ICD-10-CM | POA: Diagnosis not present

## 2022-02-17 DIAGNOSIS — Z1159 Encounter for screening for other viral diseases: Secondary | ICD-10-CM | POA: Diagnosis not present

## 2022-02-18 ENCOUNTER — Other Ambulatory Visit: Payer: Self-pay | Admitting: Family Medicine

## 2022-02-18 DIAGNOSIS — Z1231 Encounter for screening mammogram for malignant neoplasm of breast: Secondary | ICD-10-CM

## 2022-04-21 DIAGNOSIS — M25552 Pain in left hip: Secondary | ICD-10-CM | POA: Diagnosis not present

## 2022-04-21 DIAGNOSIS — M545 Low back pain, unspecified: Secondary | ICD-10-CM | POA: Diagnosis not present

## 2022-04-21 DIAGNOSIS — M19012 Primary osteoarthritis, left shoulder: Secondary | ICD-10-CM | POA: Diagnosis not present

## 2022-04-29 DIAGNOSIS — R2681 Unsteadiness on feet: Secondary | ICD-10-CM | POA: Diagnosis not present

## 2022-04-29 DIAGNOSIS — Z742 Need for assistance at home and no other household member able to render care: Secondary | ICD-10-CM | POA: Diagnosis not present

## 2022-04-29 DIAGNOSIS — M5442 Lumbago with sciatica, left side: Secondary | ICD-10-CM | POA: Diagnosis not present

## 2022-04-29 DIAGNOSIS — G8929 Other chronic pain: Secondary | ICD-10-CM | POA: Diagnosis not present

## 2022-04-29 DIAGNOSIS — I10 Essential (primary) hypertension: Secondary | ICD-10-CM | POA: Diagnosis not present

## 2022-05-05 DIAGNOSIS — I350 Nonrheumatic aortic (valve) stenosis: Secondary | ICD-10-CM | POA: Diagnosis not present

## 2022-05-05 DIAGNOSIS — E119 Type 2 diabetes mellitus without complications: Secondary | ICD-10-CM | POA: Diagnosis not present

## 2022-05-05 DIAGNOSIS — M5442 Lumbago with sciatica, left side: Secondary | ICD-10-CM | POA: Diagnosis not present

## 2022-05-05 DIAGNOSIS — E785 Hyperlipidemia, unspecified: Secondary | ICD-10-CM | POA: Diagnosis not present

## 2022-05-05 DIAGNOSIS — K219 Gastro-esophageal reflux disease without esophagitis: Secondary | ICD-10-CM | POA: Diagnosis not present

## 2022-05-05 DIAGNOSIS — E039 Hypothyroidism, unspecified: Secondary | ICD-10-CM | POA: Diagnosis not present

## 2022-05-05 DIAGNOSIS — I10 Essential (primary) hypertension: Secondary | ICD-10-CM | POA: Diagnosis not present

## 2022-05-05 DIAGNOSIS — Z79899 Other long term (current) drug therapy: Secondary | ICD-10-CM | POA: Diagnosis not present

## 2022-05-05 DIAGNOSIS — R7303 Prediabetes: Secondary | ICD-10-CM | POA: Diagnosis not present

## 2022-05-05 DIAGNOSIS — Z9181 History of falling: Secondary | ICD-10-CM | POA: Diagnosis not present

## 2022-05-05 DIAGNOSIS — Z981 Arthrodesis status: Secondary | ICD-10-CM | POA: Diagnosis not present

## 2022-05-06 DIAGNOSIS — Z79899 Other long term (current) drug therapy: Secondary | ICD-10-CM | POA: Diagnosis not present

## 2022-05-06 DIAGNOSIS — E785 Hyperlipidemia, unspecified: Secondary | ICD-10-CM | POA: Diagnosis not present

## 2022-05-06 DIAGNOSIS — R7303 Prediabetes: Secondary | ICD-10-CM | POA: Diagnosis not present

## 2022-05-06 DIAGNOSIS — M5442 Lumbago with sciatica, left side: Secondary | ICD-10-CM | POA: Diagnosis not present

## 2022-05-06 DIAGNOSIS — I10 Essential (primary) hypertension: Secondary | ICD-10-CM | POA: Diagnosis not present

## 2022-05-06 DIAGNOSIS — Z9181 History of falling: Secondary | ICD-10-CM | POA: Diagnosis not present

## 2022-05-06 DIAGNOSIS — K219 Gastro-esophageal reflux disease without esophagitis: Secondary | ICD-10-CM | POA: Diagnosis not present

## 2022-05-06 DIAGNOSIS — E119 Type 2 diabetes mellitus without complications: Secondary | ICD-10-CM | POA: Diagnosis not present

## 2022-05-06 DIAGNOSIS — E039 Hypothyroidism, unspecified: Secondary | ICD-10-CM | POA: Diagnosis not present

## 2022-05-06 DIAGNOSIS — I350 Nonrheumatic aortic (valve) stenosis: Secondary | ICD-10-CM | POA: Diagnosis not present

## 2022-05-06 DIAGNOSIS — Z981 Arthrodesis status: Secondary | ICD-10-CM | POA: Diagnosis not present

## 2022-05-12 DIAGNOSIS — R7303 Prediabetes: Secondary | ICD-10-CM | POA: Diagnosis not present

## 2022-05-12 DIAGNOSIS — Z981 Arthrodesis status: Secondary | ICD-10-CM | POA: Diagnosis not present

## 2022-05-12 DIAGNOSIS — E119 Type 2 diabetes mellitus without complications: Secondary | ICD-10-CM | POA: Diagnosis not present

## 2022-05-12 DIAGNOSIS — E039 Hypothyroidism, unspecified: Secondary | ICD-10-CM | POA: Diagnosis not present

## 2022-05-12 DIAGNOSIS — M5442 Lumbago with sciatica, left side: Secondary | ICD-10-CM | POA: Diagnosis not present

## 2022-05-12 DIAGNOSIS — E785 Hyperlipidemia, unspecified: Secondary | ICD-10-CM | POA: Diagnosis not present

## 2022-05-12 DIAGNOSIS — I10 Essential (primary) hypertension: Secondary | ICD-10-CM | POA: Diagnosis not present

## 2022-05-12 DIAGNOSIS — K219 Gastro-esophageal reflux disease without esophagitis: Secondary | ICD-10-CM | POA: Diagnosis not present

## 2022-05-12 DIAGNOSIS — Z9181 History of falling: Secondary | ICD-10-CM | POA: Diagnosis not present

## 2022-05-12 DIAGNOSIS — I350 Nonrheumatic aortic (valve) stenosis: Secondary | ICD-10-CM | POA: Diagnosis not present

## 2022-05-12 DIAGNOSIS — Z79899 Other long term (current) drug therapy: Secondary | ICD-10-CM | POA: Diagnosis not present

## 2022-05-19 DIAGNOSIS — M545 Low back pain, unspecified: Secondary | ICD-10-CM | POA: Diagnosis not present

## 2022-05-19 DIAGNOSIS — M19012 Primary osteoarthritis, left shoulder: Secondary | ICD-10-CM | POA: Diagnosis not present

## 2022-05-20 DIAGNOSIS — E039 Hypothyroidism, unspecified: Secondary | ICD-10-CM | POA: Diagnosis not present

## 2022-05-20 DIAGNOSIS — Z79899 Other long term (current) drug therapy: Secondary | ICD-10-CM | POA: Diagnosis not present

## 2022-05-20 DIAGNOSIS — Z981 Arthrodesis status: Secondary | ICD-10-CM | POA: Diagnosis not present

## 2022-05-20 DIAGNOSIS — I350 Nonrheumatic aortic (valve) stenosis: Secondary | ICD-10-CM | POA: Diagnosis not present

## 2022-05-20 DIAGNOSIS — E785 Hyperlipidemia, unspecified: Secondary | ICD-10-CM | POA: Diagnosis not present

## 2022-05-20 DIAGNOSIS — I10 Essential (primary) hypertension: Secondary | ICD-10-CM | POA: Diagnosis not present

## 2022-05-20 DIAGNOSIS — K219 Gastro-esophageal reflux disease without esophagitis: Secondary | ICD-10-CM | POA: Diagnosis not present

## 2022-05-20 DIAGNOSIS — Z9181 History of falling: Secondary | ICD-10-CM | POA: Diagnosis not present

## 2022-05-20 DIAGNOSIS — M5442 Lumbago with sciatica, left side: Secondary | ICD-10-CM | POA: Diagnosis not present

## 2022-05-20 DIAGNOSIS — R7303 Prediabetes: Secondary | ICD-10-CM | POA: Diagnosis not present

## 2022-05-20 DIAGNOSIS — E119 Type 2 diabetes mellitus without complications: Secondary | ICD-10-CM | POA: Diagnosis not present

## 2022-05-26 DIAGNOSIS — M5442 Lumbago with sciatica, left side: Secondary | ICD-10-CM | POA: Diagnosis not present

## 2022-05-26 DIAGNOSIS — E039 Hypothyroidism, unspecified: Secondary | ICD-10-CM | POA: Diagnosis not present

## 2022-05-26 DIAGNOSIS — Z9181 History of falling: Secondary | ICD-10-CM | POA: Diagnosis not present

## 2022-05-26 DIAGNOSIS — K219 Gastro-esophageal reflux disease without esophagitis: Secondary | ICD-10-CM | POA: Diagnosis not present

## 2022-05-26 DIAGNOSIS — R7303 Prediabetes: Secondary | ICD-10-CM | POA: Diagnosis not present

## 2022-05-26 DIAGNOSIS — Z981 Arthrodesis status: Secondary | ICD-10-CM | POA: Diagnosis not present

## 2022-05-26 DIAGNOSIS — I10 Essential (primary) hypertension: Secondary | ICD-10-CM | POA: Diagnosis not present

## 2022-05-26 DIAGNOSIS — Z79899 Other long term (current) drug therapy: Secondary | ICD-10-CM | POA: Diagnosis not present

## 2022-05-26 DIAGNOSIS — I350 Nonrheumatic aortic (valve) stenosis: Secondary | ICD-10-CM | POA: Diagnosis not present

## 2022-05-26 DIAGNOSIS — E785 Hyperlipidemia, unspecified: Secondary | ICD-10-CM | POA: Diagnosis not present

## 2022-05-26 DIAGNOSIS — E119 Type 2 diabetes mellitus without complications: Secondary | ICD-10-CM | POA: Diagnosis not present

## 2022-06-08 DIAGNOSIS — I10 Essential (primary) hypertension: Secondary | ICD-10-CM | POA: Diagnosis not present

## 2022-06-08 DIAGNOSIS — Z79899 Other long term (current) drug therapy: Secondary | ICD-10-CM | POA: Diagnosis not present

## 2022-06-08 DIAGNOSIS — E785 Hyperlipidemia, unspecified: Secondary | ICD-10-CM | POA: Diagnosis not present

## 2022-06-08 DIAGNOSIS — M5442 Lumbago with sciatica, left side: Secondary | ICD-10-CM | POA: Diagnosis not present

## 2022-06-08 DIAGNOSIS — E119 Type 2 diabetes mellitus without complications: Secondary | ICD-10-CM | POA: Diagnosis not present

## 2022-06-08 DIAGNOSIS — K219 Gastro-esophageal reflux disease without esophagitis: Secondary | ICD-10-CM | POA: Diagnosis not present

## 2022-06-08 DIAGNOSIS — E039 Hypothyroidism, unspecified: Secondary | ICD-10-CM | POA: Diagnosis not present

## 2022-06-08 DIAGNOSIS — R7303 Prediabetes: Secondary | ICD-10-CM | POA: Diagnosis not present

## 2022-06-08 DIAGNOSIS — Z981 Arthrodesis status: Secondary | ICD-10-CM | POA: Diagnosis not present

## 2022-06-08 DIAGNOSIS — I350 Nonrheumatic aortic (valve) stenosis: Secondary | ICD-10-CM | POA: Diagnosis not present

## 2022-06-08 DIAGNOSIS — Z9181 History of falling: Secondary | ICD-10-CM | POA: Diagnosis not present

## 2022-06-29 DIAGNOSIS — M5442 Lumbago with sciatica, left side: Secondary | ICD-10-CM | POA: Diagnosis not present

## 2022-06-29 DIAGNOSIS — E785 Hyperlipidemia, unspecified: Secondary | ICD-10-CM | POA: Diagnosis not present

## 2022-06-29 DIAGNOSIS — I10 Essential (primary) hypertension: Secondary | ICD-10-CM | POA: Diagnosis not present

## 2022-06-29 DIAGNOSIS — E039 Hypothyroidism, unspecified: Secondary | ICD-10-CM | POA: Diagnosis not present

## 2022-06-29 DIAGNOSIS — Z9181 History of falling: Secondary | ICD-10-CM | POA: Diagnosis not present

## 2022-06-29 DIAGNOSIS — Z981 Arthrodesis status: Secondary | ICD-10-CM | POA: Diagnosis not present

## 2022-06-29 DIAGNOSIS — R7303 Prediabetes: Secondary | ICD-10-CM | POA: Diagnosis not present

## 2022-06-29 DIAGNOSIS — K219 Gastro-esophageal reflux disease without esophagitis: Secondary | ICD-10-CM | POA: Diagnosis not present

## 2022-06-29 DIAGNOSIS — I350 Nonrheumatic aortic (valve) stenosis: Secondary | ICD-10-CM | POA: Diagnosis not present

## 2022-06-29 DIAGNOSIS — Z79899 Other long term (current) drug therapy: Secondary | ICD-10-CM | POA: Diagnosis not present

## 2022-06-29 DIAGNOSIS — E119 Type 2 diabetes mellitus without complications: Secondary | ICD-10-CM | POA: Diagnosis not present

## 2022-07-22 DIAGNOSIS — R0609 Other forms of dyspnea: Secondary | ICD-10-CM | POA: Diagnosis not present

## 2022-08-09 ENCOUNTER — Ambulatory Visit: Payer: Medicare Other

## 2022-08-13 DIAGNOSIS — I517 Cardiomegaly: Secondary | ICD-10-CM | POA: Diagnosis not present

## 2022-08-25 ENCOUNTER — Ambulatory Visit: Payer: Medicare Other

## 2022-09-01 DIAGNOSIS — D649 Anemia, unspecified: Secondary | ICD-10-CM | POA: Diagnosis not present

## 2022-09-01 DIAGNOSIS — L2082 Flexural eczema: Secondary | ICD-10-CM | POA: Diagnosis not present

## 2022-09-01 DIAGNOSIS — Z23 Encounter for immunization: Secondary | ICD-10-CM | POA: Diagnosis not present

## 2022-09-01 DIAGNOSIS — Z0000 Encounter for general adult medical examination without abnormal findings: Secondary | ICD-10-CM | POA: Diagnosis not present

## 2022-09-01 DIAGNOSIS — I10 Essential (primary) hypertension: Secondary | ICD-10-CM | POA: Diagnosis not present

## 2022-09-01 DIAGNOSIS — E039 Hypothyroidism, unspecified: Secondary | ICD-10-CM | POA: Diagnosis not present

## 2022-09-27 DIAGNOSIS — U071 COVID-19: Secondary | ICD-10-CM | POA: Diagnosis not present

## 2022-09-29 ENCOUNTER — Ambulatory Visit: Payer: Medicare Other

## 2022-10-15 DIAGNOSIS — I10 Essential (primary) hypertension: Secondary | ICD-10-CM | POA: Diagnosis not present

## 2022-10-20 ENCOUNTER — Ambulatory Visit: Admission: RE | Admit: 2022-10-20 | Payer: Medicare Other | Source: Ambulatory Visit

## 2022-10-20 DIAGNOSIS — Z1231 Encounter for screening mammogram for malignant neoplasm of breast: Secondary | ICD-10-CM | POA: Diagnosis not present

## 2022-11-21 IMAGING — MG MM DIGITAL SCREENING BILAT W/ TOMO AND CAD
6 of 10 series · 6 of 30 positions shown · non-contrast
Comparison: Previous exam(s).

CLINICAL DATA: Screening.

EXAM:
DIGITAL SCREENING BILATERAL MAMMOGRAM WITH TOMOSYNTHESIS AND CAD
TECHNIQUE: Bilateral screening digital craniocaudal and mediolateral oblique
mammograms were obtained. Bilateral screening digital breast
tomosynthesis was performed. The images were evaluated with
computer-aided detection.

[R CC synth-2D]
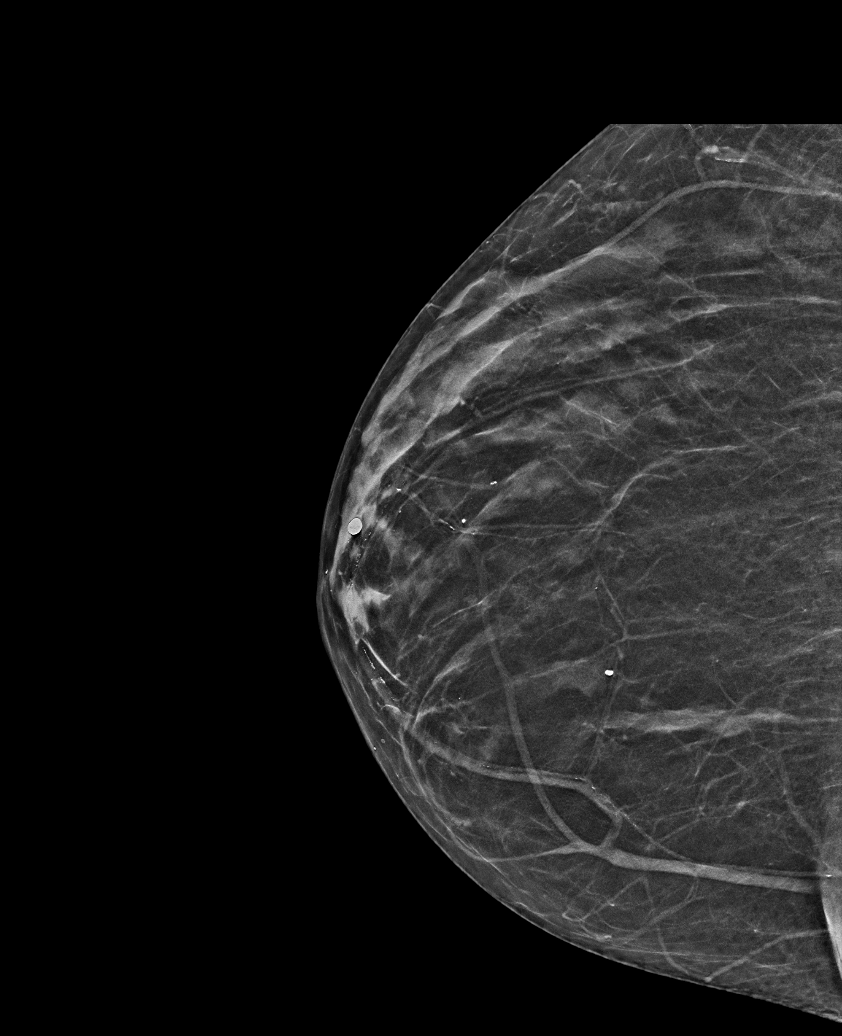

[R MLO synth-2D]
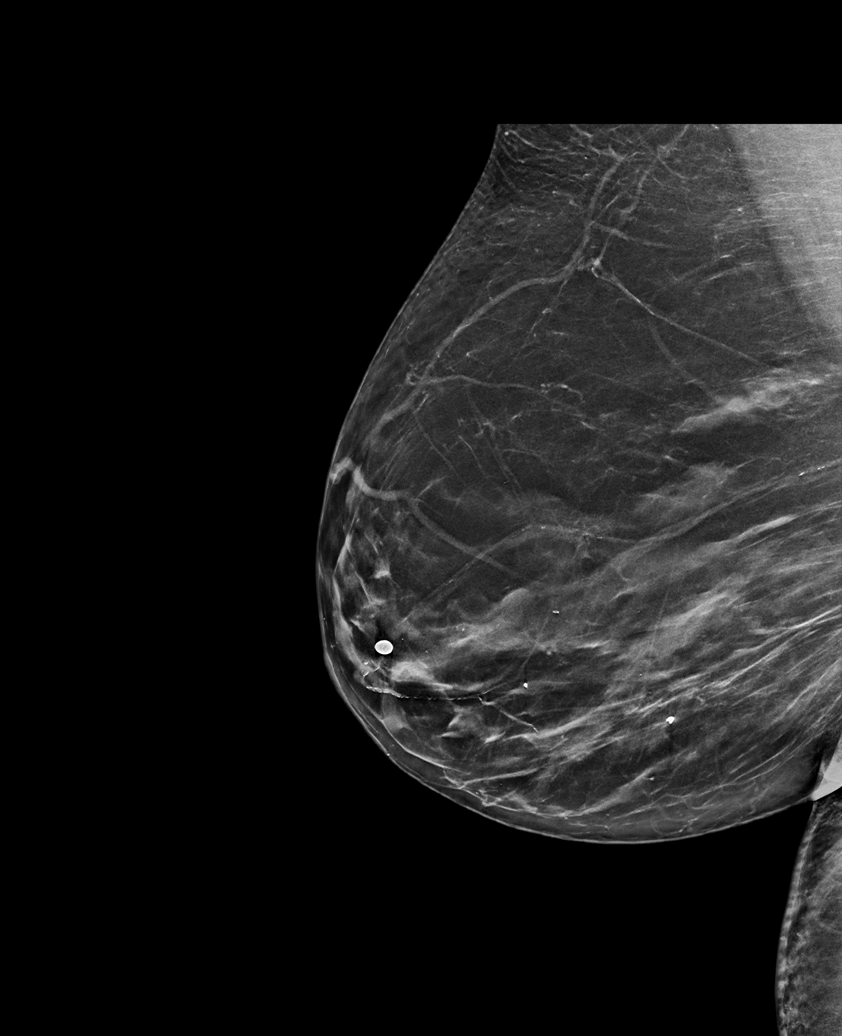

[L MLO synth-2D (1 of 2)]
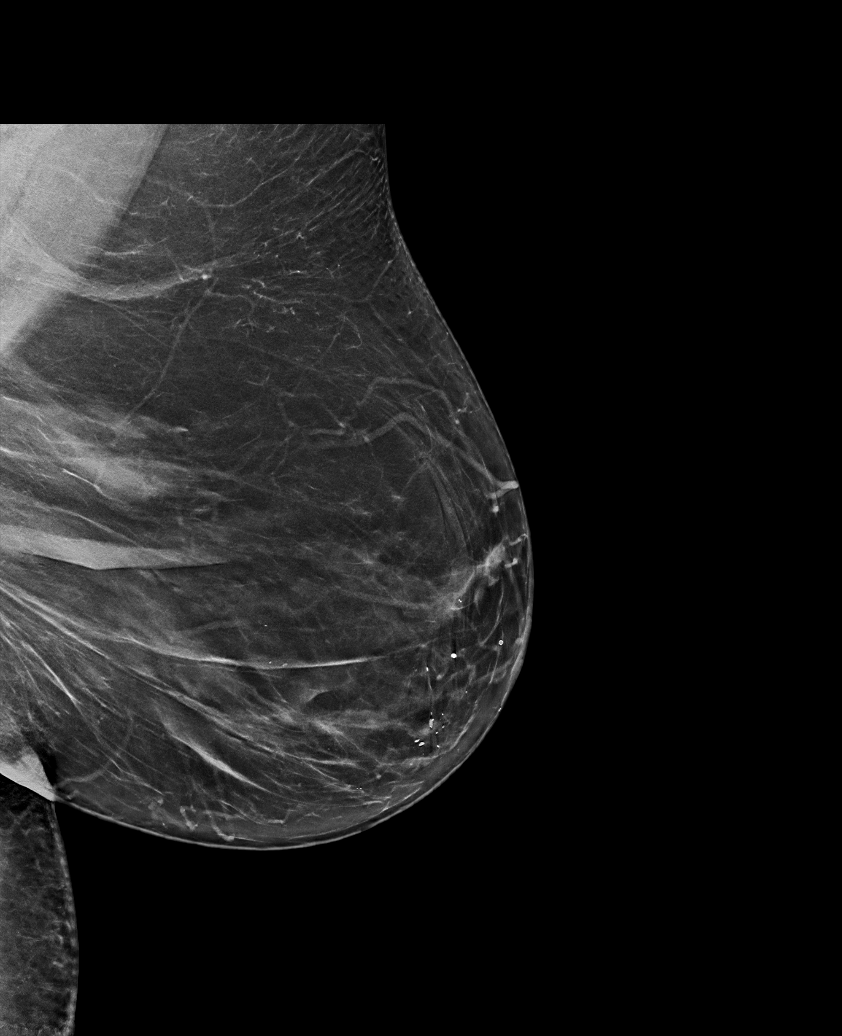

[L CC synth-2D]
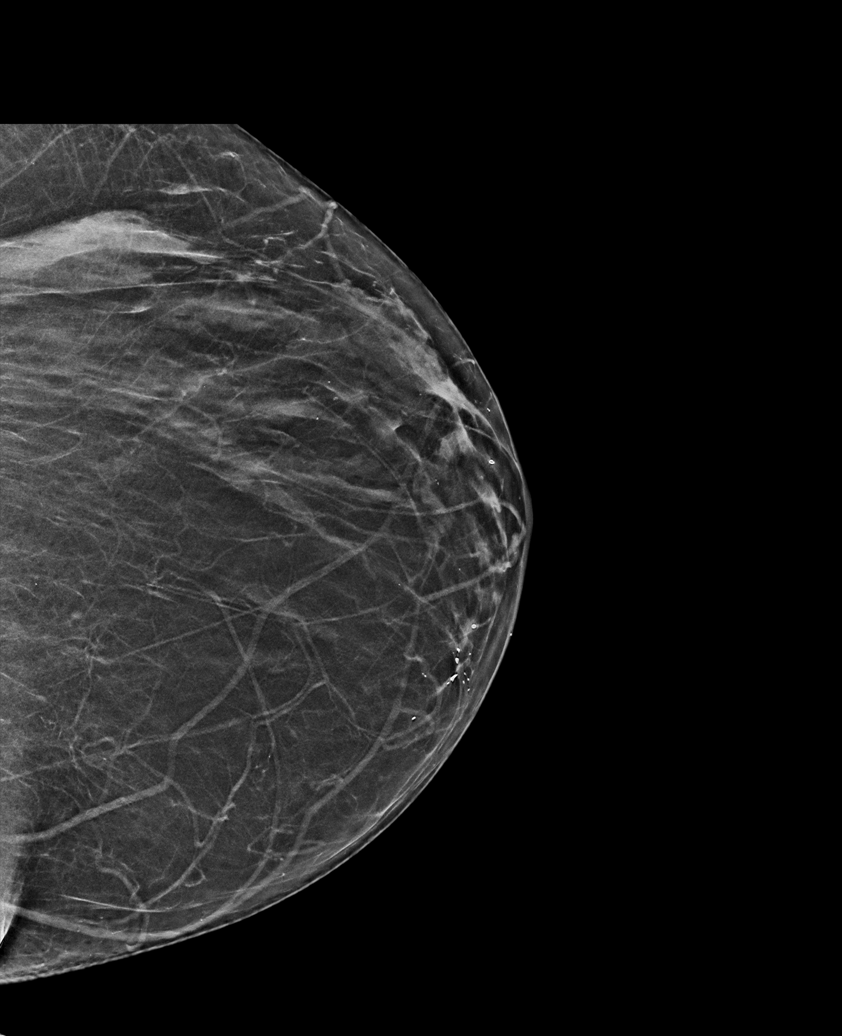

[L MLO synth-2D (2 of 2)]
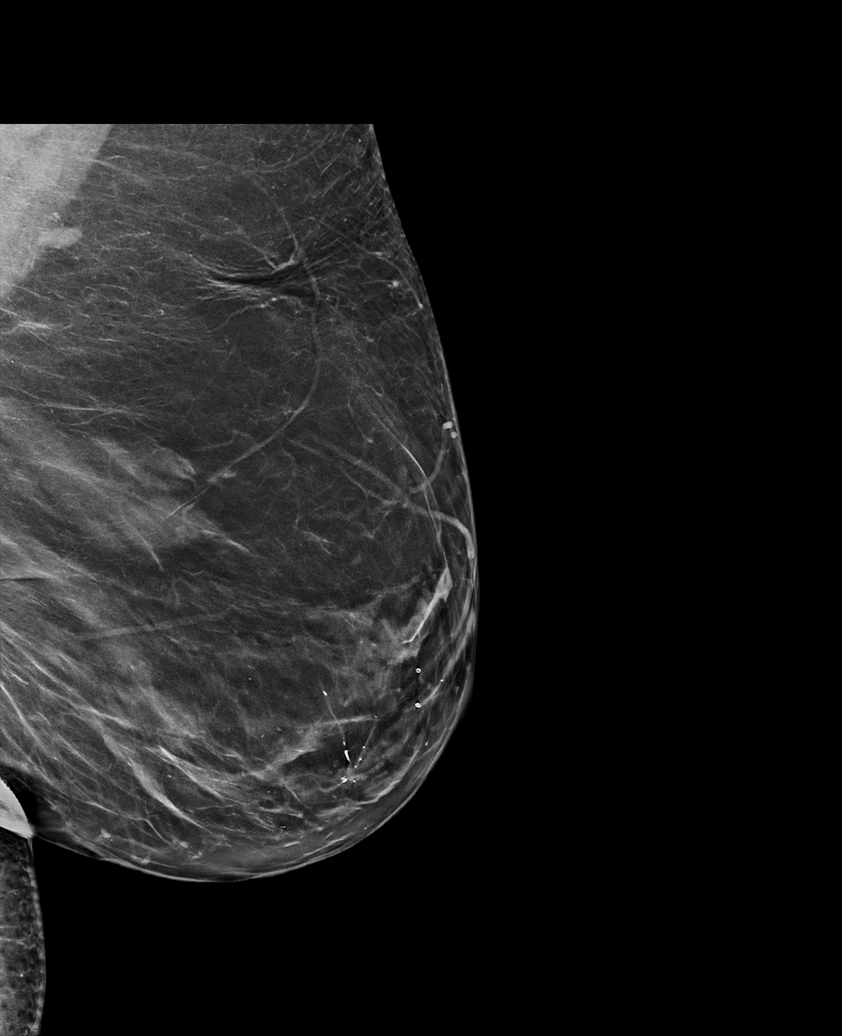

[L MLO tomo · tomo slice 37/72.0]
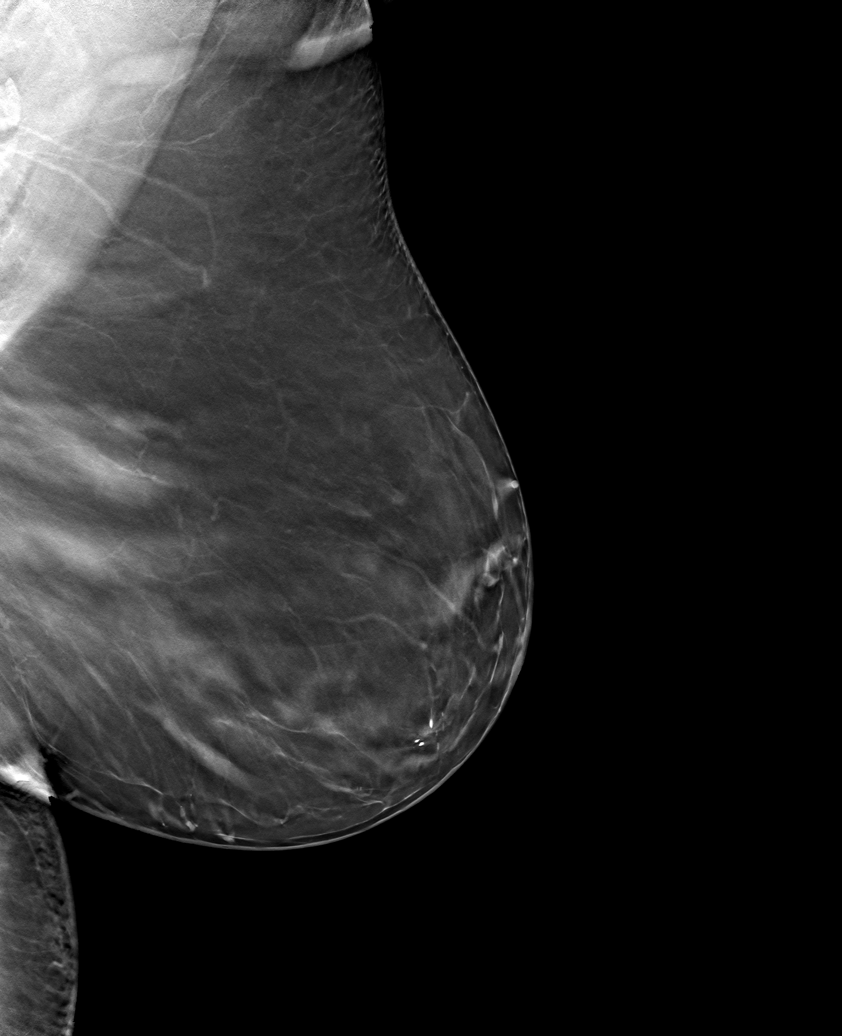

[6 of 30 positions shown; findings below may reference images not displayed]

ACR Breast Density Category b: There are scattered areas of
fibroglandular density.
FINDINGS: There are no findings suspicious for malignancy.
IMPRESSION: No mammographic evidence of malignancy. A result letter of this
screening mammogram will be mailed directly to the patient.

RECOMMENDATION:
Screening mammogram in one year. (Code:51-O-LD2)

BI-RADS CATEGORY  1: Negative.

## 2022-11-24 DIAGNOSIS — H43813 Vitreous degeneration, bilateral: Secondary | ICD-10-CM | POA: Diagnosis not present

## 2022-11-24 DIAGNOSIS — H35033 Hypertensive retinopathy, bilateral: Secondary | ICD-10-CM | POA: Diagnosis not present

## 2022-11-24 DIAGNOSIS — H40013 Open angle with borderline findings, low risk, bilateral: Secondary | ICD-10-CM | POA: Diagnosis not present

## 2022-11-24 DIAGNOSIS — H16223 Keratoconjunctivitis sicca, not specified as Sjogren's, bilateral: Secondary | ICD-10-CM | POA: Diagnosis not present

## 2022-12-04 DIAGNOSIS — G4489 Other headache syndrome: Secondary | ICD-10-CM | POA: Diagnosis not present

## 2022-12-04 DIAGNOSIS — H1132 Conjunctival hemorrhage, left eye: Secondary | ICD-10-CM | POA: Diagnosis not present

## 2022-12-08 DIAGNOSIS — R0609 Other forms of dyspnea: Secondary | ICD-10-CM | POA: Diagnosis not present

## 2022-12-29 DIAGNOSIS — Z23 Encounter for immunization: Secondary | ICD-10-CM | POA: Diagnosis not present

## 2023-01-04 DIAGNOSIS — R400 Somnolence: Secondary | ICD-10-CM | POA: Diagnosis not present

## 2023-01-04 DIAGNOSIS — R0683 Snoring: Secondary | ICD-10-CM | POA: Diagnosis not present

## 2023-01-17 DIAGNOSIS — M19012 Primary osteoarthritis, left shoulder: Secondary | ICD-10-CM | POA: Diagnosis not present

## 2023-02-10 DIAGNOSIS — G4733 Obstructive sleep apnea (adult) (pediatric): Secondary | ICD-10-CM | POA: Diagnosis not present

## 2023-02-16 DIAGNOSIS — G4733 Obstructive sleep apnea (adult) (pediatric): Secondary | ICD-10-CM | POA: Diagnosis not present

## 2023-02-28 DIAGNOSIS — G4733 Obstructive sleep apnea (adult) (pediatric): Secondary | ICD-10-CM | POA: Diagnosis not present

## 2023-03-08 DIAGNOSIS — K625 Hemorrhage of anus and rectum: Secondary | ICD-10-CM | POA: Diagnosis not present

## 2023-03-08 DIAGNOSIS — I10 Essential (primary) hypertension: Secondary | ICD-10-CM | POA: Diagnosis not present

## 2023-03-08 DIAGNOSIS — Z23 Encounter for immunization: Secondary | ICD-10-CM | POA: Diagnosis not present

## 2023-03-08 DIAGNOSIS — E039 Hypothyroidism, unspecified: Secondary | ICD-10-CM | POA: Diagnosis not present

## 2023-03-08 DIAGNOSIS — D649 Anemia, unspecified: Secondary | ICD-10-CM | POA: Diagnosis not present

## 2023-03-10 DIAGNOSIS — K625 Hemorrhage of anus and rectum: Secondary | ICD-10-CM | POA: Diagnosis not present

## 2023-05-20 DIAGNOSIS — G4733 Obstructive sleep apnea (adult) (pediatric): Secondary | ICD-10-CM | POA: Diagnosis not present

## 2023-05-27 DIAGNOSIS — G4733 Obstructive sleep apnea (adult) (pediatric): Secondary | ICD-10-CM | POA: Diagnosis not present

## 2023-06-03 DIAGNOSIS — G4733 Obstructive sleep apnea (adult) (pediatric): Secondary | ICD-10-CM | POA: Diagnosis not present

## 2023-06-06 DIAGNOSIS — G4733 Obstructive sleep apnea (adult) (pediatric): Secondary | ICD-10-CM | POA: Diagnosis not present

## 2023-06-06 DIAGNOSIS — M1010 Lead-induced gout, unspecified site: Secondary | ICD-10-CM | POA: Diagnosis not present

## 2023-06-07 DIAGNOSIS — G4733 Obstructive sleep apnea (adult) (pediatric): Secondary | ICD-10-CM | POA: Diagnosis not present

## 2023-06-07 DIAGNOSIS — R0609 Other forms of dyspnea: Secondary | ICD-10-CM | POA: Diagnosis not present

## 2023-06-20 ENCOUNTER — Other Ambulatory Visit: Payer: Self-pay | Admitting: Family Medicine

## 2023-06-20 DIAGNOSIS — Z1231 Encounter for screening mammogram for malignant neoplasm of breast: Secondary | ICD-10-CM

## 2023-06-22 DIAGNOSIS — H35033 Hypertensive retinopathy, bilateral: Secondary | ICD-10-CM | POA: Diagnosis not present

## 2023-06-22 DIAGNOSIS — H40013 Open angle with borderline findings, low risk, bilateral: Secondary | ICD-10-CM | POA: Diagnosis not present

## 2023-06-22 DIAGNOSIS — H16223 Keratoconjunctivitis sicca, not specified as Sjogren's, bilateral: Secondary | ICD-10-CM | POA: Diagnosis not present

## 2023-06-22 DIAGNOSIS — H43813 Vitreous degeneration, bilateral: Secondary | ICD-10-CM | POA: Diagnosis not present

## 2023-07-01 DIAGNOSIS — H35033 Hypertensive retinopathy, bilateral: Secondary | ICD-10-CM | POA: Diagnosis not present

## 2023-07-01 DIAGNOSIS — H16223 Keratoconjunctivitis sicca, not specified as Sjogren's, bilateral: Secondary | ICD-10-CM | POA: Diagnosis not present

## 2023-07-01 DIAGNOSIS — H43813 Vitreous degeneration, bilateral: Secondary | ICD-10-CM | POA: Diagnosis not present

## 2023-07-01 DIAGNOSIS — H401131 Primary open-angle glaucoma, bilateral, mild stage: Secondary | ICD-10-CM | POA: Diagnosis not present

## 2023-07-20 DIAGNOSIS — H16223 Keratoconjunctivitis sicca, not specified as Sjogren's, bilateral: Secondary | ICD-10-CM | POA: Diagnosis not present

## 2023-07-20 DIAGNOSIS — H40013 Open angle with borderline findings, low risk, bilateral: Secondary | ICD-10-CM | POA: Diagnosis not present

## 2023-07-20 DIAGNOSIS — H43813 Vitreous degeneration, bilateral: Secondary | ICD-10-CM | POA: Diagnosis not present

## 2023-07-20 DIAGNOSIS — H35033 Hypertensive retinopathy, bilateral: Secondary | ICD-10-CM | POA: Diagnosis not present

## 2023-08-10 DIAGNOSIS — G4733 Obstructive sleep apnea (adult) (pediatric): Secondary | ICD-10-CM | POA: Diagnosis not present

## 2023-09-13 DIAGNOSIS — R7303 Prediabetes: Secondary | ICD-10-CM | POA: Diagnosis not present

## 2023-09-13 DIAGNOSIS — K219 Gastro-esophageal reflux disease without esophagitis: Secondary | ICD-10-CM | POA: Diagnosis not present

## 2023-09-13 DIAGNOSIS — E039 Hypothyroidism, unspecified: Secondary | ICD-10-CM | POA: Diagnosis not present

## 2023-09-13 DIAGNOSIS — I10 Essential (primary) hypertension: Secondary | ICD-10-CM | POA: Diagnosis not present

## 2023-09-30 DIAGNOSIS — M19012 Primary osteoarthritis, left shoulder: Secondary | ICD-10-CM | POA: Diagnosis not present

## 2023-10-18 DIAGNOSIS — H16223 Keratoconjunctivitis sicca, not specified as Sjogren's, bilateral: Secondary | ICD-10-CM | POA: Diagnosis not present

## 2023-10-18 DIAGNOSIS — H43813 Vitreous degeneration, bilateral: Secondary | ICD-10-CM | POA: Diagnosis not present

## 2023-10-18 DIAGNOSIS — H35033 Hypertensive retinopathy, bilateral: Secondary | ICD-10-CM | POA: Diagnosis not present

## 2023-10-18 DIAGNOSIS — H401121 Primary open-angle glaucoma, left eye, mild stage: Secondary | ICD-10-CM | POA: Diagnosis not present

## 2023-10-18 DIAGNOSIS — H40011 Open angle with borderline findings, low risk, right eye: Secondary | ICD-10-CM | POA: Diagnosis not present

## 2023-10-21 ENCOUNTER — Ambulatory Visit
Admission: RE | Admit: 2023-10-21 | Discharge: 2023-10-21 | Disposition: A | Discharge status: Home / Self Care | Source: Ambulatory Visit | Attending: Family Medicine | Admitting: Family Medicine

## 2023-10-21 DIAGNOSIS — Z1231 Encounter for screening mammogram for malignant neoplasm of breast: Secondary | ICD-10-CM

## 2023-12-08 DIAGNOSIS — R0609 Other forms of dyspnea: Secondary | ICD-10-CM | POA: Diagnosis not present

## 2023-12-15 DIAGNOSIS — E039 Hypothyroidism, unspecified: Secondary | ICD-10-CM | POA: Diagnosis not present

## 2024-01-07 DIAGNOSIS — R0609 Other forms of dyspnea: Secondary | ICD-10-CM | POA: Diagnosis not present
